# Patient Record
Sex: Male | Born: 1985 | Race: Black or African American | Hispanic: No | Marital: Single | Smoking: Never smoker
Health system: Southern US, Community
[De-identification: ages and names within clinical notes are randomized; demographics above are authoritative.]

---

## 2007-09-19 ENCOUNTER — Emergency Department (HOSPITAL_COMMUNITY): Admission: EM | Admit: 2007-09-19 | Discharge: 2007-09-19 | Payer: Self-pay | Admitting: Emergency Medicine

## 2009-01-25 ENCOUNTER — Emergency Department (HOSPITAL_COMMUNITY): Admission: EM | Admit: 2009-01-25 | Discharge: 2009-01-25 | Payer: Self-pay | Admitting: Family Medicine

## 2009-01-30 ENCOUNTER — Emergency Department (HOSPITAL_COMMUNITY): Admission: EM | Admit: 2009-01-30 | Discharge: 2009-01-30 | Payer: Self-pay | Admitting: Emergency Medicine

## 2011-01-21 LAB — URINALYSIS, ROUTINE W REFLEX MICROSCOPIC
Ketones, ur: NEGATIVE mg/dL
Nitrite: NEGATIVE
Protein, ur: NEGATIVE mg/dL
Urobilinogen, UA: 0.2 mg/dL (ref 0.0–1.0)
pH: 6.5 (ref 5.0–8.0)

## 2011-01-21 LAB — CBC
Hemoglobin: 15.4 g/dL (ref 13.0–17.0)
MCHC: 34.4 g/dL (ref 30.0–36.0)
MCV: 86.1 fL (ref 78.0–100.0)
RBC: 5.19 MIL/uL (ref 4.22–5.81)

## 2011-01-21 LAB — COMPREHENSIVE METABOLIC PANEL
ALT: 16 U/L (ref 0–53)
CO2: 30 mEq/L (ref 19–32)
Calcium: 9.4 mg/dL (ref 8.4–10.5)
Creatinine, Ser: 0.94 mg/dL (ref 0.4–1.5)
GFR calc non Af Amer: >60 mL/min (ref >60)
Glucose, Bld: 92 mg/dL (ref 70–99)
Sodium: 142 mEq/L (ref 135–145)
Total Bilirubin: 0.6 mg/dL (ref 0.3–1.2)

## 2011-01-21 LAB — DIFFERENTIAL
Basophils Absolute: 0 10*3/uL (ref 0.0–0.1)
Basophils Relative: 0 % (ref 0–1)
Eosinophils Absolute: 0.2 10*3/uL (ref 0.0–0.7)
Lymphs Abs: 2.6 10*3/uL (ref 0.7–4.0)
Neutrophils Relative %: 66 % (ref 43–77)

## 2011-01-21 LAB — LIPASE, BLOOD: Lipase: 14 U/L (ref 11–59)

## 2011-07-20 LAB — STREP A DNA PROBE

## 2011-11-07 ENCOUNTER — Emergency Department (HOSPITAL_BASED_OUTPATIENT_CLINIC_OR_DEPARTMENT_OTHER)
Admission: EM | Admit: 2011-11-07 | Discharge: 2011-11-07 | Disposition: A | Discharge status: Home / Self Care | Payer: Self-pay | Attending: Emergency Medicine | Admitting: Emergency Medicine

## 2011-11-07 ENCOUNTER — Encounter (HOSPITAL_BASED_OUTPATIENT_CLINIC_OR_DEPARTMENT_OTHER): Payer: Self-pay

## 2011-11-07 ENCOUNTER — Emergency Department (INDEPENDENT_AMBULATORY_CARE_PROVIDER_SITE_OTHER): Payer: Self-pay

## 2011-11-07 DIAGNOSIS — R05 Cough: Secondary | ICD-10-CM

## 2011-11-07 DIAGNOSIS — F17200 Nicotine dependence, unspecified, uncomplicated: Secondary | ICD-10-CM | POA: Insufficient documentation

## 2011-11-07 DIAGNOSIS — R51 Headache: Secondary | ICD-10-CM | POA: Insufficient documentation

## 2011-11-07 DIAGNOSIS — R059 Cough, unspecified: Secondary | ICD-10-CM

## 2011-11-07 MED ORDER — CYCLOBENZAPRINE HCL 10 MG PO TABS: 10.0000 mg | ORAL_TABLET | Freq: Three times a day (TID) | ORAL | Status: AC | PRN

## 2011-11-07 MED ORDER — METOCLOPRAMIDE HCL 5 MG/ML IJ SOLN
10.0000 mg | Freq: Once | INTRAMUSCULAR | Status: AC
  Administered 2011-11-07: 10 mg via INTRAVENOUS
  Filled 2011-11-07: qty 2

## 2011-11-07 MED ORDER — DEXAMETHASONE SODIUM PHOSPHATE 10 MG/ML IJ SOLN
10.0000 mg | Freq: Once | INTRAMUSCULAR | Status: AC
  Administered 2011-11-07: 10 mg via INTRAVENOUS
  Filled 2011-11-07: qty 1

## 2011-11-07 MED ORDER — DIPHENHYDRAMINE HCL 50 MG/ML IJ SOLN
25.0000 mg | Freq: Once | INTRAMUSCULAR | Status: AC
  Administered 2011-11-07: 25 mg via INTRAVENOUS
  Filled 2011-11-07: qty 1

## 2011-11-07 NOTE — ED Notes (Signed)
Pt c/o HA onset January 2nd.  Pt states HA is on L, denies N/V, changes in vision.  No neuro deficits noted.

## 2011-11-07 NOTE — ED Provider Notes (Signed)
History     CSN: [id number]  Arrival date & time 11/07/11  4098   First MD Initiated Contact with Patient 11/07/11 1010      Chief Complaint  Patient presents with  . Headache    (Consider location/radiation/quality/duration/timing/severity/associated sxs/prior treatment) Patient is a 26 y.o. male presenting with headaches. The history is provided by the patient.  Headache  This is a new problem. Episode onset: One month. The problem occurs constantly. The problem has not changed since onset.The headache is associated with bright light and loud noise (Worse with lying down better when standing). The pain is located in the left unilateral region. The quality of the pain is described as dull and throbbing. The pain is at a severity of 2/10. The pain is mild. Radiates to: Left face, ear, jaw. Pertinent negatives include no fever, no nausea and no vomiting. He has tried acetaminophen, aspirin and NSAIDs for the symptoms. The treatment provided moderate relief.    History reviewed. No pertinent past medical history.  History reviewed. No pertinent past surgical history.  No family history on file.  History  Substance Use Topics  . Smoking status: Current Everyday Smoker  . Smokeless tobacco: Not on file  . Alcohol Use: Yes      Review of Systems  Constitutional: Negative for fever.  Gastrointestinal: Negative for nausea and vomiting.  Neurological: Positive for headaches. Negative for speech difficulty, weakness and numbness.       Mild memory impairment  All other systems reviewed and are negative.    Allergies  Review of patient's allergies indicates no known allergies.  Home Medications   Current Outpatient Rx  Name Route Sig Dispense Refill  . ACETAMINOPHEN 500 MG PO TABS Oral Take 500 mg by mouth every 6 (six) hours as needed.    . ASPIRIN-ACETAMINOPHEN-CAFFEINE 250-250-65 MG PO TABS Oral Take 1 tablet by mouth every 6 (six) hours as needed.    . IBUPROFEN 200 MG  PO TABS Oral Take 200 mg by mouth every 6 (six) hours as needed.      BP 141/77  Pulse 98  Temp(Src) 98.3 F (36.8 C) (Oral)  Resp 16  Ht 5\' 11"  (1.803 m)  Wt 204 lb (92.534 kg)  BMI 28.45 kg/m2  SpO2 97%  Physical Exam  Nursing note and vitals reviewed. Constitutional: He is oriented to person, place, and time. He appears well-developed and well-nourished. No distress.  HENT:  Head: Normocephalic and atraumatic.  Right Ear: Tympanic membrane and ear canal normal.  Left Ear: Tympanic membrane and ear canal normal.  Mouth/Throat: Oropharynx is clear and moist.  Eyes: Conjunctivae and EOM are normal. Pupils are equal, round, and reactive to light. Right eye exhibits no discharge. Left eye exhibits no discharge.  Fundoscopic exam:      The right eye shows no papilledema.       The left eye shows no papilledema.       photophobia  Neck: Normal range of motion. Neck supple. No spinous process tenderness and no muscular tenderness present. No rigidity. No Brudzinski's sign and no Kernig's sign noted.  Cardiovascular: Normal rate, normal heart sounds and intact distal pulses.   No murmur heard. Pulmonary/Chest: Effort normal and breath sounds normal. No respiratory distress. He has no wheezes. He has no rales.  Abdominal: Soft. He exhibits no distension. There is no tenderness.  Musculoskeletal: Normal range of motion. He exhibits no edema and no tenderness.  Lymphadenopathy:    He has no cervical adenopathy.  Neurological: He is alert and oriented to person, place, and time. He has normal strength. No cranial nerve deficit or sensory deficit. Coordination and gait normal. GCS eye subscore is 4. GCS verbal subscore is 5. GCS motor subscore is 6.  Skin: Skin is warm and dry.  Psychiatric: He has a normal mood and affect. His behavior is normal.    ED Course  Procedures (including critical care time)  Labs Reviewed - No data to display Ct Head Wo Contrast  11/07/2011  *RADIOLOGY  REPORT*  Clinical Data: Headaches for 1 month  CT HEAD WITHOUT CONTRAST  Technique:  Contiguous axial images were obtained from the base of the skull through the vertex without contrast.  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  The brain has an otherwise normal appearance without evidence for hemorrhage, infarction, hydrocephalus, or mass lesion.  There is no extra axial fluid collection.  The skull and paranasal sinuses are normal.  IMPRESSION:  1.  No acute intracranial abnormalities. 2.  Question mild tonsillar ectopia.  If the patient's symptoms persists then a non emergent MRI may be helpful for further evaluation.  Original Report Authenticated By: Rosealee Albee, M.D.     No diagnosis found.    MDM   Patient with a persistent headache for the last one month. He states it gets better with Tylenol or Excedrin and then returns. It is worse in the morning when he wakes up and then improves as the day goes on or when he is standing. He denies any focal deficits but states he has had some mild memory issues. He denies any infectious symptoms or neck pain. He has normal neurologic exam. No symptoms suggestive of a subarachnoid hemorrhage such as worst of life, abrupt onset. He denies any trauma. Head CT pending to rule out space-occupying lesion and headache cocktail given.   Patient feeling better after headache cocktail and head CT negative. Will discharge home     Gwyneth Sprout, MD 11/07/11 1118

## 2015-08-01 ENCOUNTER — Emergency Department (HOSPITAL_BASED_OUTPATIENT_CLINIC_OR_DEPARTMENT_OTHER): Payer: BLUE CROSS/BLUE SHIELD

## 2015-08-01 ENCOUNTER — Emergency Department (HOSPITAL_BASED_OUTPATIENT_CLINIC_OR_DEPARTMENT_OTHER)
Admission: EM | Admit: 2015-08-01 | Discharge: 2015-08-01 | Disposition: A | Discharge status: Home / Self Care | Payer: BLUE CROSS/BLUE SHIELD | Attending: Emergency Medicine | Admitting: Emergency Medicine

## 2015-08-01 ENCOUNTER — Encounter (HOSPITAL_BASED_OUTPATIENT_CLINIC_OR_DEPARTMENT_OTHER): Payer: Self-pay | Admitting: *Deleted

## 2015-08-01 DIAGNOSIS — Z87891 Personal history of nicotine dependence: Secondary | ICD-10-CM | POA: Insufficient documentation

## 2015-08-01 DIAGNOSIS — M542 Cervicalgia: Secondary | ICD-10-CM | POA: Diagnosis present

## 2015-08-01 DIAGNOSIS — R202 Paresthesia of skin: Secondary | ICD-10-CM | POA: Diagnosis not present

## 2015-08-01 NOTE — ED Notes (Signed)
Pain in his neck for 2 weeks. A few days after his neck started hurting. His left arm became numb. 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

## 2015-08-01 NOTE — ED Provider Notes (Signed)
CSN: [id number]     Arrival date & time 08/01/15  1548 History   First MD Initiated Contact with Patient 08/01/15 1556     Chief Complaint  Patient presents with  . Neck Pain     (Consider location/radiation/quality/duration/timing/severity/associated sxs/prior Treatment) HPI   Blood pressure 122/76, pulse 86, temperature 98.3 F (36.8 C), temperature source Oral, resp. rate 20, height 5\' 11"  (1.803 m), weight 190 lb (86.183 kg), SpO2 100 %.  Nathan Harrell is a 29 y.o. male complaining of  no significant past medical history presents today with neck pain. He states that he first noticed the pain about a week and a half ago at work as a Oncologist for [employer]. He states that the pain is centrally located in the posterior cervical spine. Pain is mild to moderate, 5 out of 10 and it alleviated with Motrin. He states that at rest his pain is mild but after a period of inactivity when he gets up the pain is more severe. Pain is not worse with general movement. Patient reports pins and needles paresthesia to the entirety of the left arm and leg both onset at the same time both onset with the leg pain approximately 10 days ago.  It is relieved with ibuprofen. Denies fever, chills, new onset weakness, numbness, bowel/bladder incontinence, abdominal pain, n/v/d, chest pain, or SOB, change in vision dysarthria, ataxia. Does not endorse shoulder pain, elbow pain, hip pain, or knee pain.  Of note, his mother is in the room and states that his speech has changed over the last week. She states that it's slower than normal but not particularly slurred  History reviewed. No pertinent past medical history. History reviewed. No pertinent past surgical history. No family history on file. Social History  Substance Use Topics  . Smoking status: Former Research scientist (life sciences)  . Smokeless tobacco: None  . Alcohol Use: Yes    Review of Systems  10 systems reviewed and found to be negative, except as noted in the  HPI.  Allergies  Review of patient's allergies indicates no known allergies.  Home Medications   Prior to Admission medications   Medication Sig Start Date End Date Taking? Authorizing Provider  acetaminophen (TYLENOL) 500 MG tablet Take 500 mg by mouth every 6 (six) hours as needed.    Historical Provider, MD  aspirin-acetaminophen-caffeine (EXCEDRIN MIGRAINE) [phone] MG per tablet Take 1 tablet by mouth every 6 (six) hours as needed.    Historical Provider, MD  ibuprofen (ADVIL,MOTRIN) 200 MG tablet Take 200 mg by mouth every 6 (six) hours as needed.    Historical Provider, MD   BP 122/76 mmHg  Pulse 86  Temp(Src) 98.3 F (36.8 C) (Oral)  Resp 20  Ht 5\' 11"  (1.803 m)  Wt 190 lb (86.183 kg)  BMI 26.51 kg/m2  SpO2 100% Physical Exam  Constitutional: He is oriented to person, place, and time. He appears well-developed and well-nourished.  HENT:  Head: Normocephalic and atraumatic.  Mouth/Throat: Oropharynx is clear and moist.  Eyes: Conjunctivae and EOM are normal. Pupils are equal, round, and reactive to light.  No TTP of maxillary or frontal sinuses  No TTP or induration of temporal arteries bilaterally  Neck: Normal range of motion. Neck supple.  FROM to C-spine. Pt can touch chin to chest without discomfort. No TTP of midline cervical spine.   Cardiovascular: Normal rate, regular rhythm and intact distal pulses.   Pulmonary/Chest: Effort normal and breath sounds normal. No respiratory distress. He has no  wheezes. He has no rales. He exhibits no tenderness.  Abdominal: Soft. Bowel sounds are normal. There is no tenderness.  Musculoskeletal: Normal range of motion. He exhibits no edema or tenderness.  Neurological: He is alert and oriented to person, place, and time. No cranial nerve deficit.  II-Visual fields grossly intact. III/IV/VI-Extraocular movements intact.  Pupils reactive bilaterally. V/VII-Smile symmetric, equal eyebrow raise,  facial sensation intact VIII-  Hearing grossly intact IX/X-Normal gag XI-bilateral shoulder shrug XII-midline tongue extension Motor: 5/5 bilaterally with normal tone and bulk Cerebellar: Normal finger-to-nose  and normal heel-to-shin test.   Romberg negative Ambulates with a coordinated gait   Nursing note and vitals reviewed.   ED Course  Procedures (including critical care time) Labs Review Labs Reviewed - No data to display  Imaging Review Ct Head Wo Contrast  08/01/2015  CLINICAL DATA:  Left arm and leg numbness.  Neck pain for 2 weeks. EXAM: CT HEAD WITHOUT CONTRAST CT CERVICAL SPINE WITHOUT CONTRAST TECHNIQUE: Multidetector CT imaging of the head and cervical spine was performed following the standard protocol without intravenous contrast. Multiplanar CT image reconstructions of the cervical spine were also generated. COMPARISON:  Head CT - 11/07/2011 FINDINGS: CT HEAD FINDINGS Gray-white differentiation is maintained. No CT evidence of acute large territory infarct. No intraparenchymal or extra-axial mass or hemorrhage. Normal size a configuration of the ventricles and basilar cisterns. No midline shift. Limited visualization of the paranasal sinuses and mastoid air cells is normal. No air-fluid levels. Regional soft tissues appear normal. CT CERVICAL SPINE FINDINGS C1 to the superior endplate of T2 is imaged. Normal alignment of the cervical spine. No anterolisthesis or retrolisthesis. The dens is normally positioned between the lateral masses of C1. Normal atlantodental and toe axial articulations. The bilateral facets are normally aligned. No fracture or static subluxation of the cervical spine. Cervical vertebral body heights are preserved. Prevertebral soft tissues are normal. Intervertebral disc space heights are preserved. Regional soft tissues appear normal. Normal noncontrast appearance of the thyroid gland. Limited visualization of lung apices is normal. IMPRESSION: 1. Negative noncontrast head CT. 2. No  fracture or static subluxation of the cervical spine. Electronically Signed   By: Sandi Mariscal M.D.   On: 08/01/2015 17:22   Ct Cervical Spine Wo Contrast  08/01/2015  CLINICAL DATA:  Left arm and leg numbness.  Neck pain for 2 weeks. EXAM: CT HEAD WITHOUT CONTRAST CT CERVICAL SPINE WITHOUT CONTRAST TECHNIQUE: Multidetector CT imaging of the head and cervical spine was performed following the standard protocol without intravenous contrast. Multiplanar CT image reconstructions of the cervical spine were also generated. COMPARISON:  Head CT - 11/07/2011 FINDINGS: CT HEAD FINDINGS Gray-white differentiation is maintained. No CT evidence of acute large territory infarct. No intraparenchymal or extra-axial mass or hemorrhage. Normal size a configuration of the ventricles and basilar cisterns. No midline shift. Limited visualization of the paranasal sinuses and mastoid air cells is normal. No air-fluid levels. Regional soft tissues appear normal. CT CERVICAL SPINE FINDINGS C1 to the superior endplate of T2 is imaged. Normal alignment of the cervical spine. No anterolisthesis or retrolisthesis. The dens is normally positioned between the lateral masses of C1. Normal atlantodental and toe axial articulations. The bilateral facets are normally aligned. No fracture or static subluxation of the cervical spine. Cervical vertebral body heights are preserved. Prevertebral soft tissues are normal. Intervertebral disc space heights are preserved. Regional soft tissues appear normal. Normal noncontrast appearance of the thyroid gland. Limited visualization of lung apices is normal. IMPRESSION: 1.  Negative noncontrast head CT. 2. No fracture or static subluxation of the cervical spine. Electronically Signed   By: Sandi Mariscal M.D.   On: 08/01/2015 17:22   I have personally reviewed and evaluated these images and lab results as part of my medical decision-making.   EKG Interpretation None      MDM   Final diagnoses:   Cervicalgia  Paresthesia    Filed Vitals:   08/01/15 1554  BP: 122/76  Pulse: 86  Temp: 98.3 F (36.8 C)  TempSrc: Oral  Resp: 20  Height: 5\' 11"  (1.803 m)  Weight: 190 lb (86.183 kg)  SpO2: 100%    Nathan Harrell is 29 y.o. male presenting with cervicalgia and pins and needles paresthesia to the entire left arm and leg. Onset 2 weeks ago. This is atraumatic. Neuro exam nonfocal, CT head and neck negative. This is a strange presentation, encouraged him to take high-dose Motrin, he doesn't have a primary care doctor given him a referral to [facility] neurology if symptoms persist.  Evaluation does not show pathology that would require ongoing emergent intervention or inpatient treatment. Pt is hemodynamically stable and mentating appropriately. Discussed findings and plan with patient/guardian, who agrees with care plan. All questions answered. Return precautions discussed and outpatient follow up given.    Monico Blitz, PA-C 08/01/15 1753  Veryl Speak, MD 08/01/15 2019

## 2015-08-01 NOTE — Discharge Instructions (Signed)
For pain control please take ibuprofen (also known as Motrin or Advil) 800mg  (this is normally 4 over the counter pills) 3 times a day  for 5 days. Take with food to minimize stomach irritation.  Please follow with your primary care doctor in the next 2 days for a check-up. They must obtain records for further management.   Do not hesitate to return to the Emergency Department for any new, worsening or concerning symptoms.    Paresthesia Paresthesia is an abnormal burning or prickling sensation. This sensation is generally felt in the hands, arms, legs, or feet. However, it may occur in any part of the body. Usually, it is not painful. The feeling may be described as:  Tingling or numbness.  Pins and needles.  Skin crawling.  Buzzing.  Limbs falling asleep.  Itching. Most people experience temporary (transient) paresthesia at some time in their lives. Paresthesia may occur when you breathe too quickly (hyperventilation). It can also occur without any apparent cause. Commonly, paresthesia occurs when pressure is placed on a nerve. The sensation quickly goes away after the pressure is removed. For some people, however, paresthesia is a long-lasting (chronic) condition that is caused by an underlying disorder. If you continue to have paresthesia, you may need further medical evaluation. HOME CARE INSTRUCTIONS Watch your condition for any changes. Taking the following actions may help to lessen any discomfort that you are feeling:  Avoid drinking alcohol.  Try acupuncture or massage to help relieve your symptoms.  Keep all follow-up visits as directed by your health care provider. This is important. SEEK MEDICAL CARE IF:  You continue to have episodes of paresthesia.  Your burning or prickling feeling gets worse when you walk.  You have pain, cramps, or dizziness.  You develop a rash. SEEK IMMEDIATE MEDICAL CARE IF:  You feel weak.  You have trouble walking or moving.  You  have problems with speech, understanding, or vision.  You feel confused.  You cannot control your bladder or bowel movements.  You have numbness after an injury.  You faint.   This information is not intended to replace advice given to you by your health care provider. Make sure you discuss any questions you have with your health care provider.   Document Released: 09/18/2002 Document Revised: 02/12/2015 Document Reviewed: 09/24/2014 Elsevier Interactive Patient Education [employer].

## 2015-08-01 NOTE — ED Notes (Signed)
Returned from CT.

## 2015-08-12 ENCOUNTER — Ambulatory Visit (INDEPENDENT_AMBULATORY_CARE_PROVIDER_SITE_OTHER): Payer: BLUE CROSS/BLUE SHIELD | Admitting: Diagnostic Neuroimaging

## 2015-08-12 ENCOUNTER — Encounter: Payer: Self-pay | Admitting: Diagnostic Neuroimaging

## 2015-08-12 VITALS — BP 121/76 | HR 76 | Ht 71.0 in | Wt 200.8 lb

## 2015-08-12 DIAGNOSIS — R200 Anesthesia of skin: Secondary | ICD-10-CM | POA: Diagnosis not present

## 2015-08-12 DIAGNOSIS — R4781 Slurred speech: Secondary | ICD-10-CM

## 2015-08-12 DIAGNOSIS — R292 Abnormal reflex: Secondary | ICD-10-CM | POA: Diagnosis not present

## 2015-08-12 NOTE — Patient Instructions (Signed)
Thank you for coming to see [place] at [facility]. I hope we have been able to provide you high quality care today.  You may receive a patient satisfaction survey over the next few weeks. We would appreciate your feedback and comments so that we may continue to improve ourselves and the health of our patients.  - I will check MRI brain and cervical spine - I will check labs testing - talk with your employer about FMLA paper work for medical leave  - caution with balance, walking, driving   ~~~~~~~~~~~~~~~~~~~~~~~~~~~~~~~~~~~~~~~~~~~~~~~~~~~~~~~~~~~~~~~~~  DR. Sinclair Alligood'S GUIDE TO HAPPY AND HEALTHY LIVING These are some of my general health and wellness recommendations. Some of them may apply to you better than others. Please use common sense as you try these suggestions and feel free to ask me any questions.   ACTIVITY/FITNESS Mental, social, emotional and physical stimulation are very important for brain and body health. Try learning a new activity (arts, music, language, sports, games).  Keep moving your body to the best of your abilities. You can do this at home, inside or outside, the park, community center, gym or anywhere you like. Consider a physical therapist or personal trainer to get started. Consider the app Sworkit. Fitness trackers such as smart-watches, smart-phones or Fitbits can help as well.   NUTRITION Eat more plants: colorful vegetables, nuts, seeds and berries.  Eat less sugar, salt, preservatives and processed foods.  Avoid toxins such as cigarettes and alcohol.  Drink water when you are thirsty. Warm water with a slice of lemon is an excellent morning drink to start the day.  Consider these websites for more information The Nutrition Source (https://www.henry-hernandez.biz/) Precision Nutrition (WindowBlog.ch)   RELAXATION Consider practicing mindfulness meditation or other relaxation techniques such  as deep breathing, prayer, yoga, tai chi, massage. See website mindful.org or the apps Headspace or Calm to help get started.   SLEEP Try to get at least 7-8+ hours sleep per day. Regular exercise and reduced caffeine will help you sleep better. Practice good sleep hygeine techniques. See website sleep.org for more information.   PLANNING Prepare estate planning, living will, healthcare POA documents. Sometimes this is best planned with the help of an attorney. Theconversationproject.org and agingwithdignity.org are excellent resources.

## 2015-08-12 NOTE — Progress Notes (Signed)
[facility]  PATIENT: Nathan Harrell DOB: 1986-09-01  REFERRING CLINICIAN: ER  HISTORY FROM: patient and mother  REASON FOR VISIT: new consult    HISTORICAL  CHIEF COMPLAINT:  Chief Complaint  Patient presents with  . Cervicalgia, paresthesia    rm 6, ED referral, New Patient, mother-Sharon    HISTORY OF PRESENT ILLNESS:   29 year old right-handed male here for evaluation of constellation of symptoms. 3 weeks ago patient woke up with left arm and left leg numbness. He had some pain in his neck on left side. Now having some numbness and pain on his right hand. Overall he is had some balance difficulty, clumsiness, speech difficulty, slurred speech. Symptoms have been persistent for approximately 3 weeks.  No prior similar events. No prior neurologic attacks of different type. No unilateral vision loss or double vision. No bowel or bladder difficulty.  Patient went to the emergency room for evaluation, had CT scan of the head which is unremarkable. Patient then referred to me for further evaluation.  No family history of autoimmune disease, multiple sclerosis, or other neurologic diseases. Maternal grandmother had brain tumor and stroke.   REVIEW OF SYSTEMS: Full 14 system review of systems performed and notable only for only as per history of present illness. Otherwise negative  ALLERGIES: No Known Allergies  HOME MEDICATIONS: Outpatient Prescriptions Prior to Visit  Medication Sig Dispense Refill  . ibuprofen (ADVIL,MOTRIN) 200 MG tablet Take 200 mg by mouth every 6 (six) hours as needed.    Marland Kitchen acetaminophen (TYLENOL) 500 MG tablet Take 500 mg by mouth every 6 (six) hours as needed.    Marland Kitchen aspirin-acetaminophen-caffeine (EXCEDRIN MIGRAINE) 250-250-65 MG per tablet Take 1 tablet by mouth every 6 (six) hours as needed.     No facility-administered medications prior to visit.    PAST MEDICAL HISTORY: No past medical history on file.  PAST SURGICAL  HISTORY: No past surgical history on file.  FAMILY HISTORY: Family History  Problem Relation Age of Onset  . Healthy Mother   . Healthy Father   . Healthy Sister   . Healthy Brother   . Brain cancer Maternal Grandmother   . Stroke Maternal Grandmother     SOCIAL HISTORY:  Social History   Social History  . Marital Status: Single    Spouse Name: N/A  . Number of Children: 0  . Years of Education: 13.5   Occupational History  .      [employer]   Social History Main Topics  . Smoking status: Never Smoker   . Smokeless tobacco: Not on file  . Alcohol Use: Yes     Comment: occasionally  . Drug Use: No  . Sexual Activity: Not on file   Other Topics Concern  . Not on file   Social History Narrative   Lives with brother   Caffeine use- coffee - approx 3 x week     PHYSICAL EXAM  GENERAL EXAM/CONSTITUTIONAL: Vitals:  Filed Vitals:   08/12/15 0841  BP: 121/76  Pulse: 76  Height: 5' 11"  (1.803 m)  Weight: 200 lb 12.8 oz (91.082 kg)     Body mass index is 28.02 kg/(m^2).  Visual Acuity Screening   Right eye Left eye Both eyes  Without correction: 20/20 20/20   With correction:        Patient is in no distress; well developed, nourished and groomed; neck is supple  CARDIOVASCULAR:  Examination of carotid arteries is normal; no carotid bruits  Regular rate  and rhythm, no murmurs  Examination of peripheral vascular system by observation and palpation is normal  EYES:  Ophthalmoscopic exam of optic discs and posterior segments is normal; no papilledema or hemorrhages  MUSCULOSKELETAL:  Gait, strength, tone, movements noted in Neurologic exam below  NEUROLOGIC: MENTAL STATUS:  No flowsheet data found.  awake, alert, oriented to person, place and time  recent and remote memory intact  normal attention and concentration  language fluent, comprehension intact, naming intact,   fund of knowledge appropriate  CRANIAL NERVE:   2nd - no  papilledema on fundoscopic exam  2nd, 3rd, 4th, 6th - pupils equal and reactive to light, visual fields full to confrontation, extraocular muscles intact, no nystagmus  5th - facial sensation symmetric  7th - facial strength symmetric  8th - hearing intact  9th - palate elevates symmetrically, uvula midline  11th - shoulder shrug symmetric  12th - tongue protrusion midline  MILD SLURRED SPEECH  MOTOR:   normal bulk and tone, full strength in the BUE, BLE  SENSORY:   normal and symmetric to light touch, pinprick, temperature, vibration; EXCEPT SLIGHT DECR PP IN LEFT HAND  COORDINATION:   finger-nose-finger, fine finger movements --> SLOWER WITH DYSMETRIA ON LEFT  REFLEXES:   deep tendon reflexes BRISK and symmetric; CLONUS IN L > R ANKLES  GAIT/STATION:   narrow based gait; able to walk on toes, heels; DIFF WITH TANDEM; romberg is negative    DIAGNOSTIC DATA (LABS, IMAGING, TESTING) - I reviewed patient records, labs, notes, testing and imaging myself where available.  Lab Results  Component Value Date   WBC 10.1 01/30/2009   HGB 15.4 01/30/2009   HCT 44.7 01/30/2009   MCV 86.1 01/30/2009   PLT 368 01/30/2009      Component Value Date/Time   NA 142 01/30/2009 1523   K 3.6 01/30/2009 1523   CL 104 01/30/2009 1523   CO2 30 01/30/2009 1523   GLUCOSE 92 01/30/2009 1523   BUN 3* 01/30/2009 1523   CREATININE 0.94 01/30/2009 1523   CALCIUM 9.4 01/30/2009 1523   PROT 7.4 01/30/2009 1523   ALBUMIN 3.9 01/30/2009 1523   AST 19 01/30/2009 1523   ALT 16 01/30/2009 1523   ALKPHOS 50 01/30/2009 1523   BILITOT 0.6 01/30/2009 1523   GFRNONAA >60 01/30/2009 1523   GFRAA  01/30/2009 1523    >60        The eGFR has been calculated using the MDRD equation. This calculation has not been validated in all clinical situations. eGFR's persistently <60 mL/min signify possible Chronic Kidney Disease.   No results found for: CHOL, HDL, LDLCALC, LDLDIRECT, TRIG,  CHOLHDL No results found for: HGBA1C No results found for: VITAMINB12 No results found for: TSH  08/01/15 CT head [I reviewed images myself and agree with interpretation. -VRP]  - Negative noncontrast head CT.  08/01/15 CT cervical spine [I reviewed images myself and agree with interpretation. -VRP]  - No fracture or static subluxation of the cervical spine.    ASSESSMENT AND PLAN  29 y.o. year old male here with new onset left-sided numbness, slurred speech, hyperreflexia. Symptoms localize to brain or cervical spine. We'll need to rule out autoimmune, inflammatory, demyelinating or vascular etiologies. Will also check blood testing to evaluate for other causes of symptoms.  Dx:  Left sided numbness - Plan: MR Brain W Wo Contrast, MR Cervical Spine W Wo Contrast, CBC with Differential/Platelet, Comprehensive metabolic panel, ANA w/Reflex, Pan-ANCA, Angiotensin converting enzyme, HIV antibody (with reflex),  Vitamin B12, TSH, Hemoglobin A1c  Slurred speech - Plan: MR Brain W Wo Contrast, MR Cervical Spine W Wo Contrast, CBC with Differential/Platelet, Comprehensive metabolic panel, ANA w/Reflex, Pan-ANCA, Angiotensin converting enzyme, HIV antibody (with reflex), Vitamin B12, TSH, Hemoglobin A1c  Hyperreflexia - Plan: MR Brain W Wo Contrast, MR Cervical Spine W Wo Contrast, CBC with Differential/Platelet, Comprehensive metabolic panel, ANA w/Reflex, Pan-ANCA, Angiotensin converting enzyme, HIV antibody (with reflex), Vitamin B12, TSH, Hemoglobin A1c    PLAN:  Orders Placed This Encounter  Procedures  . MR Brain W Wo Contrast  . MR Cervical Spine W Wo Contrast  . CBC with Differential/Platelet  . Comprehensive metabolic panel  . ANA w/Reflex  . Pan-ANCA  . Angiotensin converting enzyme  . HIV antibody (with reflex)  . Vitamin B12  . TSH  . Hemoglobin A1c   Return in about 1 month (around 09/11/2015).    Penni Bombard, MD 64/35/3912, 2:58 AM Certified in Neurology,  Neurophysiology and Neuroimaging  [facility] [address] [place], [place] [address] [phone]

## 2015-08-14 ENCOUNTER — Telehealth: Payer: Self-pay | Admitting: *Deleted

## 2015-08-14 LAB — ANGIOTENSIN CONVERTING ENZYME: ANGIO CONVERT ENZYME: 43 U/L (ref 14–82)

## 2015-08-14 LAB — COMPREHENSIVE METABOLIC PANEL
A/G RATIO: 1.6 (ref 1.1–2.5)
ALBUMIN: 4.5 g/dL (ref 3.5–5.5)
ALT: 11 IU/L (ref 0–44)
AST: 17 IU/L (ref 0–40)
Alkaline Phosphatase: 45 IU/L (ref 39–117)
BUN / CREAT RATIO: 11 (ref 8–19)
BUN: 10 mg/dL (ref 6–20)
Bilirubin Total: 0.3 mg/dL (ref 0.0–1.2)
CALCIUM: 9.5 mg/dL (ref 8.7–10.2)
CO2: 26 mmol/L (ref 18–29)
Chloride: 100 mmol/L (ref 97–106)
Creatinine, Ser: 0.95 mg/dL (ref 0.76–1.27)
GFR calc Af Amer: 125 mL/min/{1.73_m2} (ref >59)
GFR, EST NON AFRICAN AMERICAN: 108 mL/min/{1.73_m2} (ref >59)
GLOBULIN, TOTAL: 2.9 g/dL (ref 1.5–4.5)
Glucose: 84 mg/dL (ref 65–99)
POTASSIUM: 4.4 mmol/L (ref 3.5–5.2)
SODIUM: 141 mmol/L (ref 136–144)
Total Protein: 7.4 g/dL (ref 6.0–8.5)

## 2015-08-14 LAB — CBC WITH DIFFERENTIAL/PLATELET
BASOS: 0 %
Basophils Absolute: 0 10*3/uL (ref 0.0–0.2)
EOS (ABSOLUTE): 0.1 10*3/uL (ref 0.0–0.4)
EOS: 2 %
HEMATOCRIT: 47.6 % (ref 37.5–51.0)
HEMOGLOBIN: 15.8 g/dL (ref 12.6–17.7)
IMMATURE GRANULOCYTES: 0 %
Immature Grans (Abs): 0 10*3/uL (ref 0.0–0.1)
LYMPHS ABS: 2.2 10*3/uL (ref 0.7–3.1)
Lymphs: 32 %
MCH: 29 pg (ref 26.6–33.0)
MCHC: 33.2 g/dL (ref 31.5–35.7)
MCV: 88 fL (ref 79–97)
MONOS ABS: 0.4 10*3/uL (ref 0.1–0.9)
Monocytes: 6 %
Neutrophils Absolute: 4.1 10*3/uL (ref 1.4–7.0)
Neutrophils: 60 %
Platelets: 291 10*3/uL (ref 150–379)
RBC: 5.44 x10E6/uL (ref 4.14–5.80)
RDW: 13.8 % (ref 12.3–15.4)
WBC: 6.8 10*3/uL (ref 3.4–10.8)

## 2015-08-14 LAB — HEMOGLOBIN A1C
ESTIMATED AVERAGE GLUCOSE: 108 mg/dL
HEMOGLOBIN A1C: 5.4 % (ref 4.8–5.6)

## 2015-08-14 LAB — ANA W/REFLEX: Anti Nuclear Antibody(ANA): NEGATIVE

## 2015-08-14 LAB — VITAMIN B12: VITAMIN B 12: 243 pg/mL (ref 211–946)

## 2015-08-14 LAB — PAN-ANCA
ANCA Proteinase 3: <3.5 U/mL (ref 0.0–3.5)
Atypical pANCA: <1:20 {titer}
C-ANCA: <1:20 {titer}

## 2015-08-14 LAB — TSH: TSH: 1.27 u[IU]/mL (ref 0.450–4.500)

## 2015-08-14 LAB — HIV ANTIBODY (ROUTINE TESTING W REFLEX): HIV SCREEN 4TH GENERATION: NONREACTIVE

## 2015-08-14 NOTE — Telephone Encounter (Signed)
Left VM for patient informing him his lab results are normal. Also informed him he needs to call [facility] at [phone] to schedule his MRIs. Left this caller's name , number. Phone staff may repeat above message if patient calls back.

## 2015-08-24 ENCOUNTER — Other Ambulatory Visit: Payer: BLUE CROSS/BLUE SHIELD

## 2015-08-30 ENCOUNTER — Ambulatory Visit
Admission: RE | Admit: 2015-08-30 | Discharge: 2015-08-30 | Disposition: A | Discharge status: Home / Self Care | Payer: BLUE CROSS/BLUE SHIELD | Source: Ambulatory Visit | Attending: Diagnostic Neuroimaging | Admitting: Diagnostic Neuroimaging

## 2015-08-30 DIAGNOSIS — R4781 Slurred speech: Secondary | ICD-10-CM

## 2015-08-30 DIAGNOSIS — R292 Abnormal reflex: Secondary | ICD-10-CM

## 2015-08-30 DIAGNOSIS — R200 Anesthesia of skin: Secondary | ICD-10-CM

## 2015-08-30 MED ORDER — GADOBENATE DIMEGLUMINE 529 MG/ML IV SOLN
19.0000 mL | Freq: Once | INTRAVENOUS | Status: AC | PRN
  Administered 2015-08-30: 19 mL via INTRAVENOUS

## 2015-09-09 ENCOUNTER — Telehealth: Payer: Self-pay | Admitting: Diagnostic Neuroimaging

## 2015-09-09 NOTE — Telephone Encounter (Signed)
I called patient. No answer x 3. Will try again in AM. -VRP

## 2015-09-10 NOTE — Telephone Encounter (Signed)
I called patient. Patient numbness is stable/slightly better. Neck pain stable. Balance still affected. I reviewed MRI results. 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 Will see patient this Thursday and determine futher course (malignancy screening, labs, LP, vs referral to academic center / [facility]).  Penni Bombard, MD [id number], 0000000 PM Certified in Neurology, Neurophysiology and Neuroimaging  [facility] [address] [place], [place] [address] [phone]

## 2015-09-12 ENCOUNTER — Encounter: Payer: Self-pay | Admitting: Diagnostic Neuroimaging

## 2015-09-12 ENCOUNTER — Ambulatory Visit (INDEPENDENT_AMBULATORY_CARE_PROVIDER_SITE_OTHER): Payer: BLUE CROSS/BLUE SHIELD | Admitting: Diagnostic Neuroimaging

## 2015-09-12 VITALS — BP 125/78 | HR 79 | Ht 71.0 in | Wt 206.0 lb

## 2015-09-12 DIAGNOSIS — D496 Neoplasm of unspecified behavior of brain: Secondary | ICD-10-CM

## 2015-09-12 NOTE — Patient Instructions (Signed)
-   I will setup additional testing

## 2015-09-12 NOTE — Progress Notes (Signed)
[facility]  PATIENT: Nathan Harrell DOB: 1986-03-12  REFERRING CLINICIAN:  HISTORY FROM: patient and mother  REASON FOR VISIT: follow up   HISTORICAL  CHIEF COMPLAINT:  Chief Complaint  Patient presents with  . Left sided numbness    rm 6, motherIvin Harrell, review MRI results  . Follow-up    1 month     HISTORY OF PRESENT ILLNESS:   UPDATE 09/12/15: Since last visit, sxs slightly improved. Numbness has resolved. Balance and speech are stable. 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 Here for further discussion and diagnosis/treatment options. Now established with Dr. Stephanie Harrell for PCP. Had steroid shot last week with PCP for neck pain symptoms.   PRIOR HPI (08/12/15): 29 year old right-handed male here for evaluation of constellation of symptoms. 3 weeks ago patient woke up with left arm and left leg numbness. He had some pain in his neck on left side. Now having some numbness and pain on his right hand. Overall he is had some balance difficulty, clumsiness, speech difficulty, slurred speech. Symptoms have been persistent for approximately 3 weeks. No prior similar events. No prior neurologic attacks of different type. No unilateral vision loss or double vision. No bowel or bladder difficulty. Patient went to the emergency room for evaluation, had CT scan of the head which is unremarkable. Patient then referred to me for further evaluation. No family history of autoimmune disease, multiple sclerosis, or other neurologic diseases. Maternal grandmother had brain tumor and stroke.   REVIEW OF SYSTEMS: Full 14 system review of systems performed and notable only for numbness back pain walking diff neck pain neck stiffness.    ALLERGIES: No Known Allergies  HOME MEDICATIONS: Outpatient Prescriptions Prior to Visit  Medication Sig Dispense Refill  . ibuprofen (ADVIL,MOTRIN) 200 MG tablet Take 200 mg by mouth every 6 (six) hours as needed.    Marland Kitchen acetaminophen  (TYLENOL) 500 MG tablet Take 500 mg by mouth every 6 (six) hours as needed.    Marland Kitchen aspirin-acetaminophen-caffeine (EXCEDRIN MIGRAINE) 250-250-65 MG per tablet Take 1 tablet by mouth every 6 (six) hours as needed.     No facility-administered medications prior to visit.    PAST MEDICAL HISTORY: History reviewed. No pertinent past medical history.  PAST SURGICAL HISTORY: History reviewed. No pertinent past surgical history.  FAMILY HISTORY: Family History  Problem Relation Age of Onset  . Healthy Mother   . Healthy Father   . Healthy Sister   . Healthy Brother   . Brain cancer Maternal Grandmother   . Stroke Maternal Grandmother     SOCIAL HISTORY:  Social History   Social History  . Marital Status: Single    Spouse Name: N/A  . Number of Children: 0  . Years of Education: 13.5   Occupational History  .      [employer]   Social History Main Topics  . Smoking status: Never Smoker   . Smokeless tobacco: Not on file  . Alcohol Use: Yes     Comment: occasionally  . Drug Use: No  . Sexual Activity: Not on file   Other Topics Concern  . Not on file   Social History Narrative   Lives with brother   Caffeine use- coffee - approx 3 x week     PHYSICAL EXAM  GENERAL EXAM/CONSTITUTIONAL: Vitals:  Filed Vitals:   09/12/15 1621  BP: 125/78  Pulse: 79  Height: 5\' 11"  (1.803 m)  Weight: 206 lb (93.441 kg)   Body mass index is 28.74  kg/(m^2). No exam data present  Patient is in no distress; well developed, nourished and groomed; neck is supple  CARDIOVASCULAR:  Examination of carotid arteries is normal; no carotid bruits  Regular rate and rhythm, no murmurs  Examination of peripheral vascular system by observation and palpation is normal  EYES:  Ophthalmoscopic exam of optic discs and posterior segments is normal; no papilledema or hemorrhages  MUSCULOSKELETAL:  Gait, strength, tone, movements noted in Neurologic exam below  NEUROLOGIC: MENTAL STATUS:   No flowsheet data found.  awake, alert, oriented to person, place and time  recent and remote memory intact  normal attention and concentration  language fluent, comprehension intact, naming intact,   fund of knowledge appropriate  CRANIAL NERVE:   2nd - no papilledema on fundoscopic exam  2nd, 3rd, 4th, 6th - pupils equal and reactive to light, visual fields full to confrontation, extraocular muscles intact, no nystagmus  5th - facial sensation symmetric  7th - facial strength symmetric; DECR RIGHT NL FOLD  8th - hearing intact  9th - palate elevates symmetrically, uvula midline  11th - shoulder shrug symmetric  12th - tongue protrusion midline  MILD SLURRED SPEECH  MOTOR:   normal bulk and tone, full strength in the BUE, BLE  SENSORY:   normal and symmetric to light touch, pinprick, temperature, vibration; EXCEPT SLIGHT DECR PP IN LEFT HAND  COORDINATION:   finger-nose-finger, fine finger movements --> SLOWER WITH DYSMETRIA ON LEFT  REFLEXES:   deep tendon reflexes BRISK and symmetric; CLONUS IN L > R ANKLES  GAIT/STATION:   narrow based gait; able to walk on toes, heels; DIFF WITH TANDEM; romberg is negative    DIAGNOSTIC DATA (LABS, IMAGING, TESTING) - I reviewed patient records, labs, notes, testing and imaging myself where available.  Lab Results  Component Value Date   WBC 6.8 08/12/2015   HGB 15.4 01/30/2009   HCT 47.6 08/12/2015   MCV 86.1 01/30/2009   PLT 368 01/30/2009      Component Value Date/Time   NA 141 08/12/2015 0930   NA 142 01/30/2009 1523   K 4.4 08/12/2015 0930   CL 100 08/12/2015 0930   CO2 26 08/12/2015 0930   GLUCOSE 84 08/12/2015 0930   GLUCOSE 92 01/30/2009 1523   BUN 10 08/12/2015 0930   BUN 3* 01/30/2009 1523   CREATININE 0.95 08/12/2015 0930   CALCIUM 9.5 08/12/2015 0930   PROT 7.4 08/12/2015 0930   PROT 7.4 01/30/2009 1523   ALBUMIN 4.5 08/12/2015 0930   ALBUMIN 3.9 01/30/2009 1523   AST 17 08/12/2015  0930   ALT 11 08/12/2015 0930   ALKPHOS 45 08/12/2015 0930   BILITOT 0.3 08/12/2015 0930   BILITOT 0.6 01/30/2009 1523   GFRNONAA 108 08/12/2015 0930   GFRAA 125 08/12/2015 0930   No results found for: CHOL, HDL, LDLCALC, LDLDIRECT, TRIG, CHOLHDL Lab Results  Component Value Date   HGBA1C 5.4 08/12/2015   Lab Results  Component Value Date   VITAMINB12 243 08/12/2015   Lab Results  Component Value Date   TSH 1.270 08/12/2015    08/01/15 CT head [I reviewed images myself and agree with interpretation. -VRP]  - Negative noncontrast head CT.  08/01/15 CT cervical spine [I reviewed images myself and agree with interpretation. -VRP]  - No fracture or static subluxation of the cervical spine.   08/30/15 MRI brain/cervical [I reviewed images myself and agree with interpretation. -VRP]  - Enhancing mass lesion in the medulla. There is incomplete enhancement of  the lesion with surrounding nonenhancing edema. This extends from the lower pons through the medulla anteriorly. 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 The other possibility consider would be a tumefactive demyelinating lesion, although this is less likely given lack of other white matter lesions and the location in the medulla.  - Mild cervical disc degeneration C5-6 and C6-7. No cord lesion identified.     ASSESSMENT AND PLAN  29 y.o. year old male here with new onset left-sided numbness, slurred speech, hyperreflexia. 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 Demyelinating / inflamm disease less likely. WiIl proceed with workup.   Dx:  Brain tumor ([place]) - Plan: CT Chest W Contrast, NM PET Tum Img Whole Body, CT Abdomen Pelvis W Contrast, Ambulatory referral to Neurology, CANCELED: CT Abdomen W Contrast    PLAN: - CT screening - PET screening - second opinion referral to [facility]  Orders Placed This Encounter  Procedures  . CT Chest W Contrast  . NM PET Tum Img Whole Body  . CT Abdomen Pelvis  W Contrast  . Ambulatory referral to Neurology   Return in about 6 weeks (around 10/24/2015).  I reviewed images, labs, notes, records myself. I summarized findings and reviewed with patient, for this high risk condition (possible brain tumor) requiring high complexity decision making.   Penni Bombard, MD [id number], [id number] PM Certified in Neurology, Neurophysiology and Neuroimaging  [facility] [address] [place], [place] [address] [phone]

## 2015-10-09 ENCOUNTER — Other Ambulatory Visit: Payer: Self-pay | Admitting: Diagnostic Neuroimaging

## 2015-10-10 ENCOUNTER — Ambulatory Visit
Admission: RE | Admit: 2015-10-10 | Discharge: 2015-10-10 | Disposition: A | Discharge status: Home / Self Care | Payer: BLUE CROSS/BLUE SHIELD | Source: Ambulatory Visit | Attending: Diagnostic Neuroimaging | Admitting: Diagnostic Neuroimaging

## 2015-10-10 DIAGNOSIS — D496 Neoplasm of unspecified behavior of brain: Secondary | ICD-10-CM

## 2015-10-10 MED ORDER — IOPAMIDOL (ISOVUE-300) INJECTION 61%
125.0000 mL | Freq: Once | INTRAVENOUS | Status: AC | PRN
  Administered 2015-10-10: 125 mL via INTRAVENOUS

## 2015-10-17 ENCOUNTER — Telehealth: Payer: Self-pay | Admitting: *Deleted

## 2015-10-17 NOTE — Telephone Encounter (Signed)
[name] from [facility] returned call, stated he "is new and took over [employer] job". He stated he doesn't see anything and will give info to nurse to investigate. Gave him patient's name, MRN and referral details. He stated he would try to call back today; advised he may leave message with phone staff if he cannot reach this RN and has no further questions. He verbalized understanding.

## 2015-10-17 NOTE — Telephone Encounter (Signed)
Spoke with patient and informed him, per Dr Leta Baptist ct results are normal. Inquired if he has heard from [facility] re: referral, and he stated he has not.  Gave him number and also told him this RN will FU with [facility]. He confirmed his FU on 10/24/15, asked he arrive 15 min early to check in. He verbalized understanding.  [place], [phone], LVM requesting call back.

## 2015-10-23 NOTE — Telephone Encounter (Signed)
LVM reminding pt of FU tomorrow and inquiring if he has heard from [facility]. Advised he may wait until tomorrow if he does not want to call back today. Left this caller's name, number.

## 2015-10-24 ENCOUNTER — Encounter: Payer: Self-pay | Admitting: Diagnostic Neuroimaging

## 2015-10-24 ENCOUNTER — Ambulatory Visit (INDEPENDENT_AMBULATORY_CARE_PROVIDER_SITE_OTHER): Payer: BLUE CROSS/BLUE SHIELD | Admitting: Diagnostic Neuroimaging

## 2015-10-24 VITALS — BP 140/95 | HR 75 | Ht 71.0 in | Wt 210.0 lb

## 2015-10-24 DIAGNOSIS — R930 Abnormal findings on diagnostic imaging of skull and head, not elsewhere classified: Secondary | ICD-10-CM | POA: Diagnosis not present

## 2015-10-24 DIAGNOSIS — R9089 Other abnormal findings on diagnostic imaging of central nervous system: Secondary | ICD-10-CM

## 2015-10-24 DIAGNOSIS — D496 Neoplasm of unspecified behavior of brain: Secondary | ICD-10-CM | POA: Diagnosis not present

## 2015-10-24 NOTE — Patient Instructions (Signed)
-   repeat MRI brain - second opinion with [facility]

## 2015-10-24 NOTE — Progress Notes (Signed)
[facility]  PATIENT: Nathan Harrell DOB: [birth year]  REFERRING CLINICIAN:  HISTORY FROM: patient and mother  REASON FOR VISIT: follow up   HISTORICAL  CHIEF COMPLAINT:  Chief Complaint  Patient presents with  . Brain Tumor    rm 7, parents -Elly Modena, "mild HA, good relief w/Ibuprofen, avg 3 HA/wk"  . Follow-up    6 week    HISTORY OF PRESENT ILLNESS:   UPDATE 10/24/15: Since last visit, symptoms sig improved. Mild residual headaches. Gait, speech and numbness back to normal. Not heard yet from [facility] for second opinion.   UPDATE 09/12/15: Since last visit, sxs slightly improved. Numbness has resolved. Balance and speech are stable. 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 Here for further discussion and diagnosis/treatment options. Now established with Dr. Stephanie Acre for PCP. Had steroid shot last week with PCP for neck pain symptoms.   PRIOR HPI (08/12/15): 30 year old right-handed male here for evaluation of constellation of symptoms. 3 weeks ago patient woke up with left arm and left leg numbness. He had some pain in his neck on left side. Now having some numbness and pain on his right hand. Overall he is had some balance difficulty, clumsiness, speech difficulty, slurred speech. Symptoms have been persistent for approximately 3 weeks. No prior similar events. No prior neurologic attacks of different type. No unilateral vision loss or double vision. No bowel or bladder difficulty. Patient went to the emergency room for evaluation, had CT scan of the head which is unremarkable. Patient then referred to me for further evaluation. No family history of autoimmune disease, multiple sclerosis, or other neurologic diseases. Maternal grandmother had brain tumor and stroke.   REVIEW OF SYSTEMS: Full 14 system review of systems performed and notable only for headache.    ALLERGIES: No Known Allergies  HOME MEDICATIONS: Outpatient Prescriptions Prior to Visit   Medication Sig Dispense Refill  . ibuprofen (ADVIL,MOTRIN) 200 MG tablet Take 200 mg by mouth every 6 (six) hours as needed.    Marland Kitchen acetaminophen (TYLENOL) 500 MG tablet Take 500 mg by mouth every 6 (six) hours as needed. Reported on 10/24/2015    . aspirin-acetaminophen-caffeine (EXCEDRIN MIGRAINE) 250-250-65 MG per tablet Take 1 tablet by mouth every 6 (six) hours as needed. Reported on 10/24/2015     No facility-administered medications prior to visit.    PAST MEDICAL HISTORY: History reviewed. No pertinent past medical history.  PAST SURGICAL HISTORY: History reviewed. No pertinent past surgical history.  FAMILY HISTORY: Family History  Problem Relation Age of Onset  . Healthy Mother   . Healthy Father   . Healthy Sister   . Healthy Brother   . Brain cancer Maternal Grandmother   . Stroke Maternal Grandmother     SOCIAL HISTORY:  Social History   Social History  . Marital Status: Single    Spouse Name: N/A  . Number of Children: 0  . Years of Education: 13.5   Occupational History  .      [employer]   Social History Main Topics  . Smoking status: Never Smoker   . Smokeless tobacco: Not on file  . Alcohol Use: Yes     Comment: occasionally  . Drug Use: No  . Sexual Activity: Not on file   Other Topics Concern  . Not on file   Social History Narrative   Lives with brother   Caffeine use- coffee - approx 3 x week     PHYSICAL EXAM  GENERAL EXAM/CONSTITUTIONAL: Vitals:  Filed  Vitals:   10/24/15 1329  BP: 140/95  Pulse: 75  Height: 5\' 11"  (1.803 m)  Weight: 210 lb (95.255 kg)   Body mass index is 29.3 kg/(m^2). No exam data present  Patient is in no distress; well developed, nourished and groomed; neck is supple  CARDIOVASCULAR:  Examination of carotid arteries is normal; no carotid bruits  Regular rate and rhythm, no murmurs  Examination of peripheral vascular system by observation and palpation is normal  EYES:  Ophthalmoscopic exam of  optic discs and posterior segments is normal; no papilledema or hemorrhages  MUSCULOSKELETAL:  Gait, strength, tone, movements noted in Neurologic exam below  NEUROLOGIC: MENTAL STATUS:  No flowsheet data found.  awake, alert, oriented to person, place and time  recent and remote memory intact  normal attention and concentration  language fluent, comprehension intact, naming intact,   fund of knowledge appropriate  CRANIAL NERVE:   2nd - no papilledema on fundoscopic exam  2nd, 3rd, 4th, 6th - pupils equal and reactive to light, visual fields full to confrontation, extraocular muscles intact, no nystagmus  5th - facial sensation symmetric  7th - facial strength symmetric  8th - hearing intact  9th - palate elevates symmetrically, uvula midline  11th - shoulder shrug symmetric  12th - tongue protrusion midline   MOTOR:   normal bulk and tone, full strength in the BUE, BLE  SENSORY:   normal and symmetric to light touch, pinprick, temperature, vibration  COORDINATION:   finger-nose-finger, fine finger movements --> SLOWER WITH DYSMETRIA ON LEFT  REFLEXES:   deep tendon reflexes BRISK and symmetric; CLONUS IN L > R ANKLES  GAIT/STATION:   narrow based gait; able to walk TANDEM; romberg is negative    DIAGNOSTIC DATA (LABS, IMAGING, TESTING) - I reviewed patient records, labs, notes, testing and imaging myself where available.  Lab Results  Component Value Date   WBC 6.8 08/12/2015   HGB 15.4 01/30/2009   HCT 47.6 08/12/2015   MCV 88 08/12/2015   PLT 291 08/12/2015      Component Value Date/Time   NA 141 08/12/2015 0930   NA 142 01/30/2009 1523   K 4.4 08/12/2015 0930   CL 100 08/12/2015 0930   CO2 26 08/12/2015 0930   GLUCOSE 84 08/12/2015 0930   GLUCOSE 92 01/30/2009 1523   BUN 10 08/12/2015 0930   BUN 3* 01/30/2009 1523   CREATININE 0.95 08/12/2015 0930   CALCIUM 9.5 08/12/2015 0930   PROT 7.4 08/12/2015 0930   PROT 7.4 01/30/2009  1523   ALBUMIN 4.5 08/12/2015 0930   ALBUMIN 3.9 01/30/2009 1523   AST 17 08/12/2015 0930   ALT 11 08/12/2015 0930   ALKPHOS 45 08/12/2015 0930   BILITOT 0.3 08/12/2015 0930   BILITOT 0.6 01/30/2009 1523   GFRNONAA 108 08/12/2015 0930   GFRAA 125 08/12/2015 0930   No results found for: CHOL, HDL, LDLCALC, LDLDIRECT, TRIG, CHOLHDL Lab Results  Component Value Date   HGBA1C 5.4 08/12/2015   Lab Results  Component Value Date   VITAMINB12 243 08/12/2015   Lab Results  Component Value Date   TSH 1.270 08/12/2015    08/01/15 CT head [I reviewed images myself and agree with interpretation. -VRP]  - Negative noncontrast head CT.  08/01/15 CT cervical spine [I reviewed images myself and agree with interpretation. -VRP]  - No fracture or static subluxation of the cervical spine.  08/30/15 MRI brain/cervical [I reviewed images myself and agree with interpretation. -VRP]  - Enhancing  mass lesion in the medulla. There is incomplete enhancement of the lesion with surrounding nonenhancing edema. This extends from the lower pons through the medulla anteriorly. 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 The other possibility consider would be a tumefactive demyelinating lesion, although this is less likely given lack of other white matter lesions and the location in the medulla.  - Mild cervical disc degeneration C5-6 and C6-7. No cord lesion identified.  10/10/15 CT chest, abdomen and pelvis - normal    ASSESSMENT AND PLAN  30 y.o. year old male here with new onset left-sided numbness, slurred speech, hyperreflexia. 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 However, spontaneous improvement raises possibility of tumefactive demyelinating disease. Will repeat MRI to see if lesion is changing is size or stable.    Dx:  No diagnosis found.   PLAN: - repeat MRI brain - follow up PET scan screening - second opinion referral to [facility]  Return in about 6 weeks  (around 12/05/2015).   Penni Bombard, MD [id number], [id number] PM Certified in Neurology, Neurophysiology and Neuroimaging  [facility] [address] [place], [place] [address] [phone]

## 2015-10-24 NOTE — Progress Notes (Signed)
Pet scan not completed as ordered. Spoke with Jeanell Sparrow, [place], order was PA but expired on 10/18/15. While on phone, order resubmitted with new claim; Ray stated it required peer to peer or additional information faxed for review. Successfully faxed office notes, all scan/MRI reports to [phone].

## 2015-10-24 NOTE — Telephone Encounter (Addendum)
Patient in office for FU, has not heard from [place] at [place]. LVM for william requesting call back asap regarding patient's referral. Left this caller's name, number.  3:39 pm  Attempted to return call from [place]. LVM.

## 2015-10-30 ENCOUNTER — Telehealth: Payer: Self-pay | Admitting: Diagnostic Neuroimaging

## 2015-10-30 ENCOUNTER — Ambulatory Visit (INDEPENDENT_AMBULATORY_CARE_PROVIDER_SITE_OTHER): Payer: BLUE CROSS/BLUE SHIELD

## 2015-10-30 DIAGNOSIS — D496 Neoplasm of unspecified behavior of brain: Secondary | ICD-10-CM | POA: Diagnosis not present

## 2015-10-30 DIAGNOSIS — R930 Abnormal findings on diagnostic imaging of skull and head, not elsewhere classified: Secondary | ICD-10-CM | POA: Diagnosis not present

## 2015-10-30 DIAGNOSIS — R9089 Other abnormal findings on diagnostic imaging of central nervous system: Secondary | ICD-10-CM

## 2015-10-30 NOTE — Telephone Encounter (Signed)
Patient is calling to ask if he can get a steroid shot. He states he had one a couple of months ago at  [facility] because of neck pain. He says his symptoms have come back with the neck pain. Can he get one here or does he need to go back to [facility]/ Please call to advise. Thank you.

## 2015-10-31 ENCOUNTER — Telehealth: Payer: Self-pay | Admitting: *Deleted

## 2015-10-31 MED ORDER — GADOPENTETATE DIMEGLUMINE 469.01 MG/ML IV SOLN: 20.0000 mL | Freq: Once | INTRAVENOUS | Status: AC | PRN

## 2015-10-31 NOTE — Telephone Encounter (Signed)
Patient form on Mary Chesson desk.  

## 2015-10-31 NOTE — Telephone Encounter (Signed)
PET scan order clarified by Dr Leta Baptist. Spoke with Denny Peon, nuclear radiology to confirm patient PET scan order. Appointment scheduled for 11/01/15, arrive 8:30 am, scan @ 9 am. Spoke with patient who confirmed he can do PET scan tomorrow, arrive 8:30 am at [facility] radiology.  Advised he be NPO after midnight tonight. He stated he has not heard from [facility]; advised him this RN will call to check on status of referral.  Patient verbalized understanding of above conversation. Spoke with Denny Peon and confirmed patient will arrive at 8:30 am tomorrow for scan.

## 2015-10-31 NOTE — Telephone Encounter (Signed)
LVM requesting call back re: status of patient's referral. Left this caller's name, number.

## 2015-10-31 NOTE — Telephone Encounter (Signed)
LVM requesting call back. Advised that Dr Leta Baptist would likely refer him back to [facility] for injection. Inquired if he has heard from [facility], had MRI scheduled and heard back re: PET scan. Left this caller's name, number.

## 2015-10-31 NOTE — Telephone Encounter (Signed)
Received call back from [name], [name]. He stated he had documentation that the RN has tried to reach patient several times, and he has not returned the calls. [name] requested this RN give pt direct name, number of RN at [facility]. Spoke with [name] and gave him name, number: Nathan Harrell, [phone]. Strongly encouraged him to call right away to avoid further delay. He verbalized understanding, agreement.

## 2015-11-01 ENCOUNTER — Encounter (HOSPITAL_COMMUNITY)
Admission: RE | Admit: 2015-11-01 | Discharge: 2015-11-01 | Disposition: A | Discharge status: Home / Self Care | Payer: BLUE CROSS/BLUE SHIELD | Source: Ambulatory Visit | Attending: Diagnostic Neuroimaging | Admitting: Diagnostic Neuroimaging

## 2015-11-01 DIAGNOSIS — D496 Neoplasm of unspecified behavior of brain: Secondary | ICD-10-CM

## 2015-11-01 DIAGNOSIS — Z0189 Encounter for other specified special examinations: Secondary | ICD-10-CM | POA: Diagnosis present

## 2015-11-01 LAB — GLUCOSE, CAPILLARY: GLUCOSE-CAPILLARY: 82 mg/dL (ref 65–99)

## 2015-11-01 MED ORDER — FLUDEOXYGLUCOSE F - 18 (FDG) INJECTION: 10.4400 | Freq: Once | INTRAVENOUS | Status: DC | PRN

## 2015-11-05 ENCOUNTER — Telehealth: Payer: Self-pay | Admitting: Diagnostic Neuroimaging

## 2015-11-05 NOTE — Telephone Encounter (Signed)
LVM for medical records; requested call back to get MRI on CD to send to [facility]. Left this caller's name, number.

## 2015-11-05 NOTE — Telephone Encounter (Signed)
Spoke with Loma Sousa, [facility] and requested CD of MRI on patient. She stated she will send directly to [facility]. Gave her full name, address and name of RN, Danella Deis to mail CD. She repeated correctly, verbalized understanding.

## 2015-11-05 NOTE — Telephone Encounter (Signed)
Nathan Harrell with [facility] is returning your call.  She was not quite sure what you needed but asked for a return call [phone] opt 5.  Thanks!

## 2015-11-05 NOTE — Telephone Encounter (Signed)
Patient called to advise [facility] needs MRI results on CD sent to [facility], [place], [place], [address], [place] [id number].

## 2015-11-07 ENCOUNTER — Telehealth: Payer: Self-pay | Admitting: *Deleted

## 2015-11-07 DIAGNOSIS — Z0289 Encounter for other administrative examinations: Secondary | ICD-10-CM

## 2015-11-07 NOTE — Telephone Encounter (Signed)
Spoke with patient who stated he is waiting on call back from [facility] for his appointment. Informed him that CD was sent to [facility] earlier this week as requested. He stated he does have a name and number at [facility] he can call. He stated that his symptoms "got a little worse", so Dr [facility] prescribed Prednisone. He is taking 10 mg daily and has  tapering off schedule. He stated that "my headaches are gone, but my right hand is still a little bit numb, but that's all". He stated he "feels much better". Informed him that [employer] disability forms will be completed after he sees dr at [facility] due to the fact that his recent imaging shows decrease in size of brain lesion, per Dr Leta Baptist. Requested he call back after he sees dr at [facility] and for any problems, questions, needs.  He verbalized understanding, appreciation.

## 2015-11-21 ENCOUNTER — Telehealth: Payer: Self-pay | Admitting: *Deleted

## 2015-11-21 DIAGNOSIS — Z0289 Encounter for other administrative examinations: Secondary | ICD-10-CM

## 2015-11-21 NOTE — Telephone Encounter (Signed)
Spoke with patietn who stated he is waiting on call back from [facility] with his appointment. Informed him that his [employer] forms are ready to be faxed, but he must pay fee.  He stated he will call back to pay fee over the phone. [employer] forms taken to MR so that patient can speak with MR when he calls back to pay fee.  He verbalized understanding of above.

## 2015-11-21 NOTE — Telephone Encounter (Signed)
Darnell Level with [facility] called said they have not been able to get in touch with pt as he was not returning phone calls, they were leaving messages but he would not return the call. Today they were able to get in touch with him and make an appt at [facility] office, she doesn't know the date

## 2015-11-21 NOTE — Telephone Encounter (Signed)
Spoke with patient to FU on referral to [facility]. He stated he has not heard from [facility] about an appointment. He further stated his headaches, neck pain and numbness in all fingertips returned approximately 1 week after being seen in this office 10/24/15. He stated he was on Prednisone dose pack which relieved his headaches and neck pain but did not help his numbness. He stated he received a steroid injection in his neck yesterday from PCP. He stated he called [facility] yesterday, but did not speak with anyone.  Informed him this RN will call [facility] today and call him back  re: appt at [facility] and Dr Gladstone Lighter reply to his reoccurring pain, numbness. He then stated he cannot work, and his job is requesting papers to be completed by tomorrow. Informed him this RN would work on papers today, but cannot promise they will be completed by tomorrow. He verbalized understanding, appreciation.  LVM for Danella Deis, RN, [phone], [place] requesting call back. Then spoke with [name], [phone] at same center who stated the patient has been contacted, has not always returned calls. He stated Dr Tommi Rumps has reviewed patient's information, and [name] will contact dr for his response. He will then call this RN back. Requested he ask phone staff to skype this RN if before 12 noon today. He verbalized understanding.

## 2015-11-25 NOTE — Telephone Encounter (Signed)
I spoke with the patient to inform him that his [employer] form was faxed on 11/25/15.

## 2015-12-09 ENCOUNTER — Ambulatory Visit: Payer: BLUE CROSS/BLUE SHIELD | Admitting: Diagnostic Neuroimaging

## 2015-12-09 ENCOUNTER — Telehealth: Payer: Self-pay | Admitting: *Deleted

## 2015-12-09 NOTE — Telephone Encounter (Signed)
LVM requesting patient call back to reschedule FU with Dr Leta Baptist. Patient was seen at [facility] on 12/06/15, and per Dr Leta Baptist may follow up there. Left this caller's name, number.

## 2015-12-10 ENCOUNTER — Encounter: Payer: Self-pay | Admitting: Diagnostic Neuroimaging

## 2015-12-20 DIAGNOSIS — G379 Demyelinating disease of central nervous system, unspecified: Secondary | ICD-10-CM | POA: Insufficient documentation

## 2015-12-23 DIAGNOSIS — K5900 Constipation, unspecified: Secondary | ICD-10-CM | POA: Insufficient documentation

## 2015-12-27 DIAGNOSIS — Z531 Procedure and treatment not carried out because of patient's decision for reasons of belief and group pressure: Secondary | ICD-10-CM | POA: Insufficient documentation

## 2016-01-01 DIAGNOSIS — R6889 Other general symptoms and signs: Secondary | ICD-10-CM | POA: Insufficient documentation

## 2016-04-22 DIAGNOSIS — G369 Acute disseminated demyelination, unspecified: Secondary | ICD-10-CM | POA: Insufficient documentation

## 2016-05-08 DIAGNOSIS — R2681 Unsteadiness on feet: Secondary | ICD-10-CM | POA: Insufficient documentation

## 2016-12-03 IMAGING — CT CT HEAD W/O CM
3 series · 17 of 30 positions shown, 18 images · non-contrast
Comparison: Head CT - 11/07/2011

CLINICAL DATA: Left arm and leg numbness.  Neck pain for 2 weeks.

EXAM:
CT HEAD WITHOUT CONTRAST
CT CERVICAL SPINE WITHOUT CONTRAST
TECHNIQUE: Multidetector CT imaging of the head and cervical spine was
performed following the standard protocol without intravenous
contrast. Multiplanar CT image reconstructions of the cervical spine
were also generated.

[Series 2: head 4.8 h37s · axial · 0.46mm/px · z∈[-116,-13]mm · 4 of 36 slices shown, 5 images]
[im 8/36  brain]
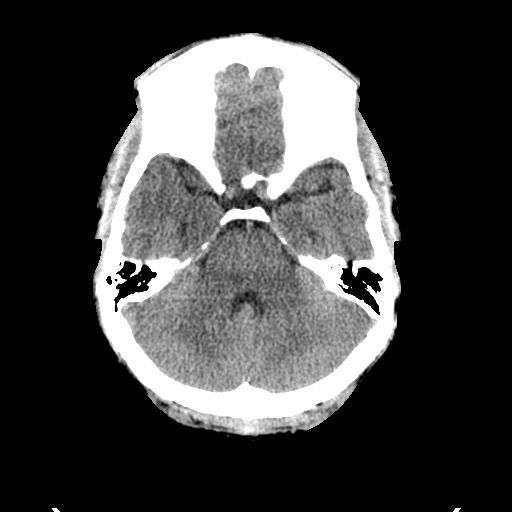
[im 8/36  bone]
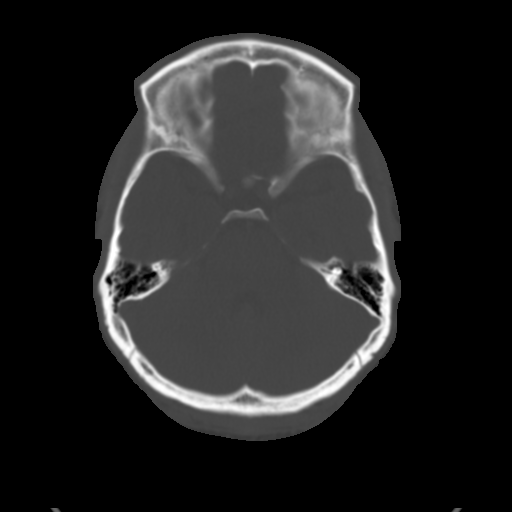
[im 15/36  brain]
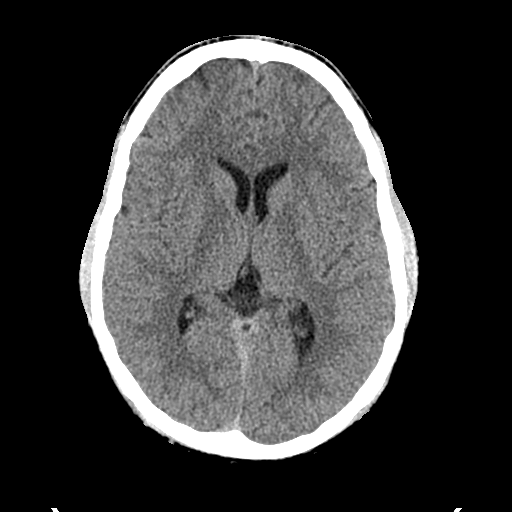
[im 22/36  brain]
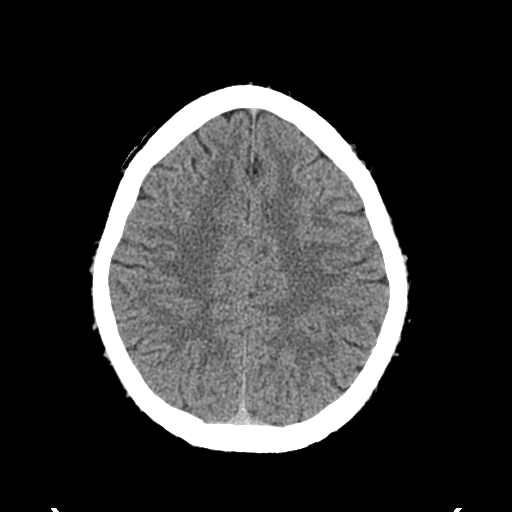
[im 29/36  brain]
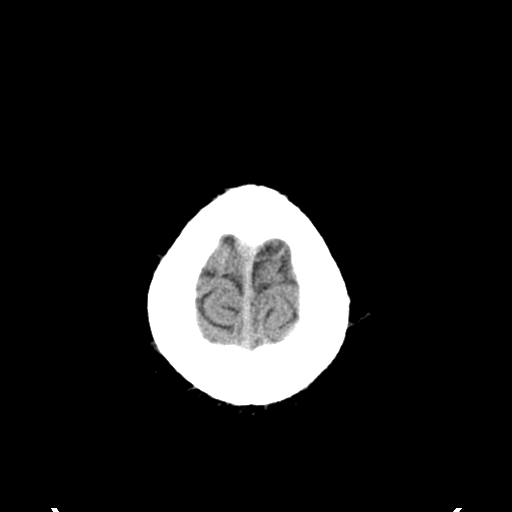

[Series 5: c_spine 2.0 b41s st · axial · 0.21mm/px · z∈[-315,-235]mm · 5 of 88 slices shown]
[im 8/88  brain]
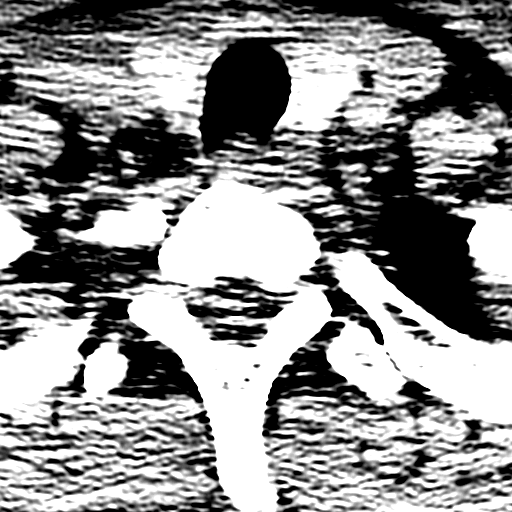
[im 16/88  brain]
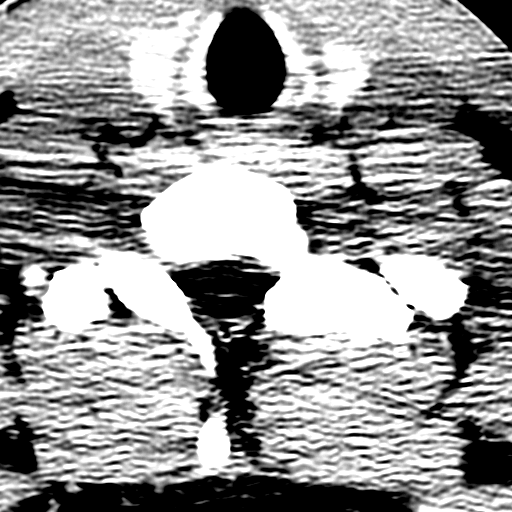
[im 32/88  brain]
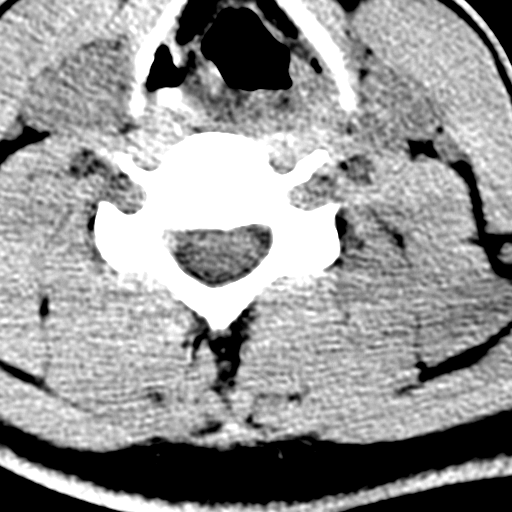
[im 40/88  brain]
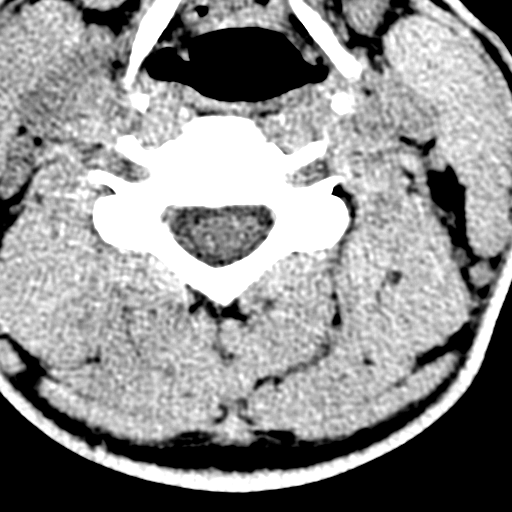
[im 48/88  brain]
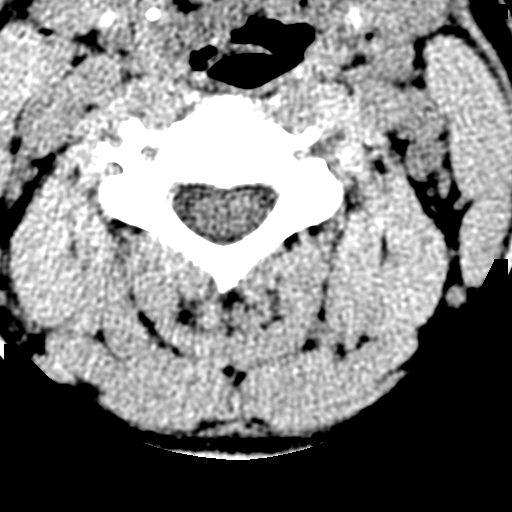

[Series 10: c_spine 2.0 orth ax · axial · 0.15mm/px · z∈[-324,-189]mm · 8 of 88 slices shown]
[im 8/88  brain]
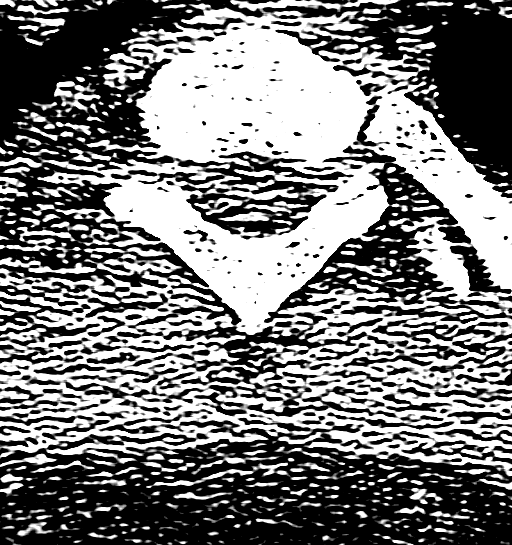
[im 16/88  brain]
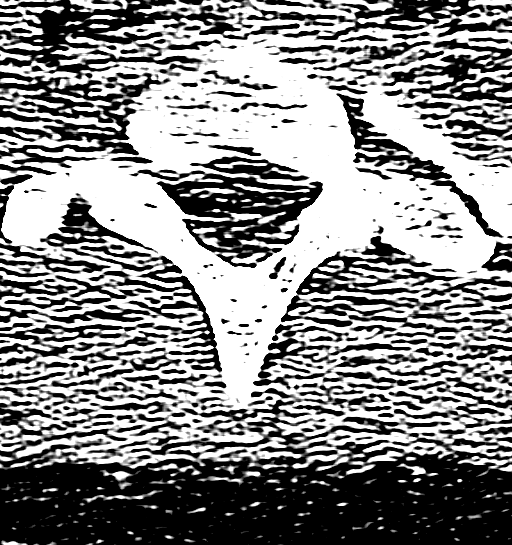
[im 32/88  brain]
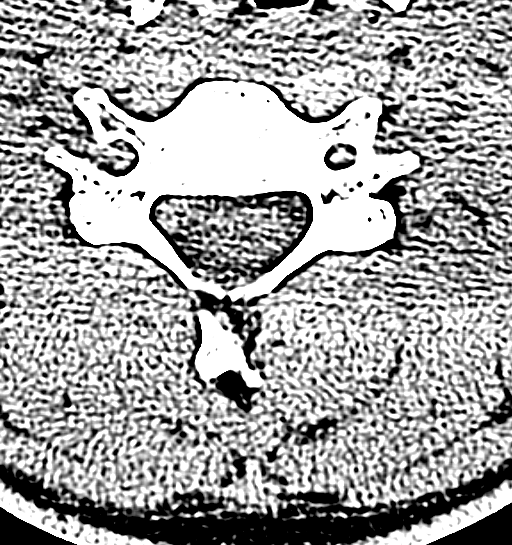
[im 40/88  brain]
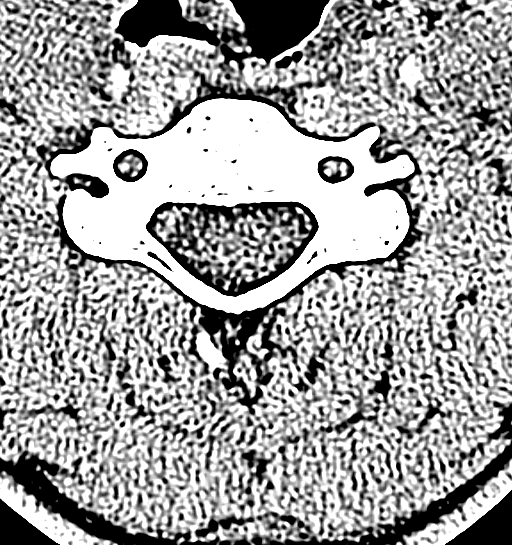
[im 48/88  brain]
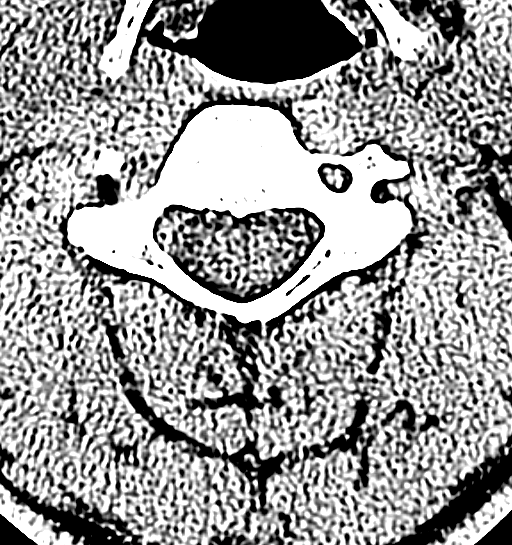
[im 56/88  brain]
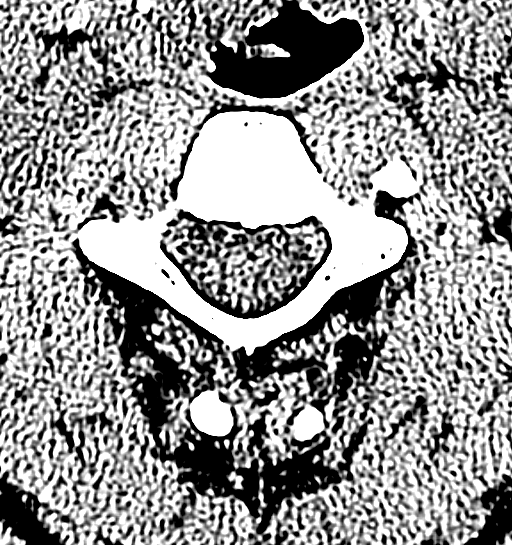
[im 72/88  brain]
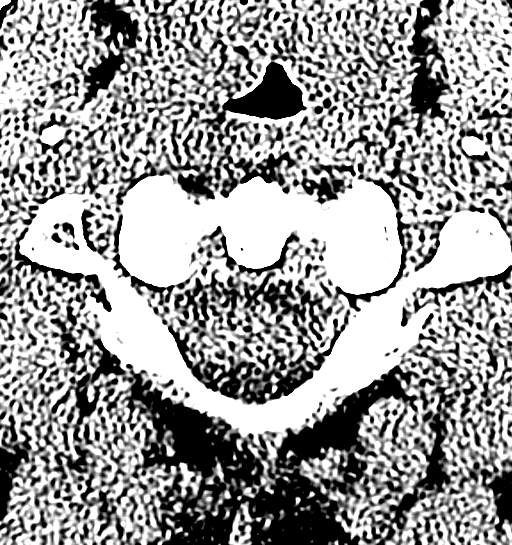
[im 80/88  brain]
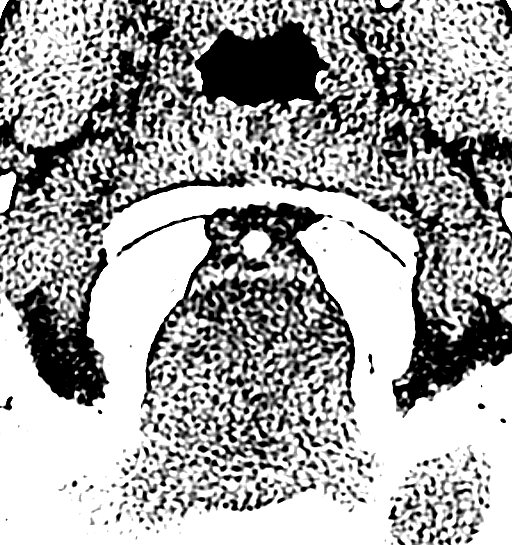

[17 of 30 positions shown; findings below may reference images not displayed]

FINDINGS: CT HEAD FINDINGS

Gray-white differentiation is maintained. No CT evidence of acute
large territory infarct. No intraparenchymal or extra-axial mass or
hemorrhage. Normal size a configuration of the ventricles and
basilar cisterns. No midline shift. Limited visualization of the
paranasal sinuses and mastoid air cells is normal. No air-fluid
levels. Regional soft tissues appear normal.

CT CERVICAL SPINE FINDINGS

C1 to the superior endplate of T2 is imaged.

Normal alignment of the cervical spine. No anterolisthesis or
retrolisthesis. The dens is normally positioned between the lateral
masses of C1. Normal atlantodental and toe axial articulations. The
bilateral facets are normally aligned.

No fracture or static subluxation of the cervical spine. Cervical
vertebral body heights are preserved. Prevertebral soft tissues are
normal.

Intervertebral disc space heights are preserved.

Regional soft tissues appear normal. Normal noncontrast appearance
of the thyroid gland. Limited visualization of lung apices is
normal.
IMPRESSION: 1. Negative noncontrast head CT.
2. No fracture or static subluxation of the cervical spine.

## 2017-12-23 DIAGNOSIS — Z0000 Encounter for general adult medical examination without abnormal findings: Secondary | ICD-10-CM | POA: Diagnosis not present

## 2017-12-23 DIAGNOSIS — Z23 Encounter for immunization: Secondary | ICD-10-CM | POA: Diagnosis not present

## 2017-12-23 DIAGNOSIS — Z131 Encounter for screening for diabetes mellitus: Secondary | ICD-10-CM | POA: Diagnosis not present

## 2017-12-23 DIAGNOSIS — Z13220 Encounter for screening for lipoid disorders: Secondary | ICD-10-CM | POA: Diagnosis not present

## 2018-02-11 DIAGNOSIS — G510 Bell's palsy: Secondary | ICD-10-CM | POA: Diagnosis not present

## 2022-01-23 ENCOUNTER — Emergency Department (HOSPITAL_BASED_OUTPATIENT_CLINIC_OR_DEPARTMENT_OTHER)
Admission: EM | Admit: 2022-01-23 | Discharge: 2022-01-23 | Disposition: A | Discharge status: Home / Self Care | Payer: BC Managed Care – PPO | Attending: Emergency Medicine | Admitting: Emergency Medicine

## 2022-01-23 ENCOUNTER — Other Ambulatory Visit (HOSPITAL_BASED_OUTPATIENT_CLINIC_OR_DEPARTMENT_OTHER): Payer: Self-pay

## 2022-01-23 ENCOUNTER — Other Ambulatory Visit: Payer: Self-pay

## 2022-01-23 ENCOUNTER — Encounter (HOSPITAL_BASED_OUTPATIENT_CLINIC_OR_DEPARTMENT_OTHER): Payer: Self-pay

## 2022-01-23 DIAGNOSIS — M79662 Pain in left lower leg: Secondary | ICD-10-CM | POA: Diagnosis not present

## 2022-01-23 DIAGNOSIS — G5702 Lesion of sciatic nerve, left lower limb: Secondary | ICD-10-CM | POA: Insufficient documentation

## 2022-01-23 MED ORDER — METHOCARBAMOL 500 MG PO TABS
500.0000 mg | ORAL_TABLET | Freq: Two times a day (BID) | ORAL | 0 refills | Status: AC
  Filled 2022-01-23: qty 60, 30d supply, fill #0

## 2022-01-23 NOTE — ED Triage Notes (Signed)
Pt arrives ambulatory to ED with c/o  pain to left upper leg/hip states that he has history of sciatica and this feels the same with this episode lasting about 3 months. 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  ?

## 2022-01-23 NOTE — ED Provider Notes (Signed)
?[facility] EMERGENCY DEPARTMENT ?Provider Note ? ? ?CSN: [id number] ?Arrival date & time: 01/23/22  2423 ? ?  ? ?History ? ?Chief Complaint  ?Patient presents with  ? Leg Injury  ? ? ?Nathan Harrell is a 36 y.o. male who presents to the ED for evaluation of left lower extremity pain.  Patient notes pain has been on and off for quite a long time, however current episode appears to be lasting for approximately 3 months.  Starts in his left mid glute and shoots down the back of his thigh.  Patient has been doing stretches and taking ibuprofen in the morning without significant improvement.  No acute injury or trauma.  He denies back pain, saddle paresthesia, urinary and bowel incontinence.  No other complaints. ? ?HPI ? ?  ? ?Home Medications ?Prior to Admission medications   ?Medication Sig Start Date End Date Taking? Authorizing Provider  ?methocarbamol (ROBAXIN) 500 MG tablet Take 1 tablet (500 mg total) by mouth 2 (two) times daily. 01/23/22 02/22/22 Yes Kathe Becton R, PA-C  ?acetaminophen (TYLENOL) 500 MG tablet Take 500 mg by mouth every 6 (six) hours as needed. Reported on 10/24/2015    [provider]  ?aspirin-acetaminophen-caffeine (EXCEDRIN MIGRAINE) [phone] MG per tablet Take 1 tablet by mouth every 6 (six) hours as needed. Reported on 10/24/2015    [provider]  ?ibuprofen (ADVIL,MOTRIN) 200 MG tablet Take 200 mg by mouth every 6 (six) hours as needed.    [provider]  ?predniSONE (STERAPRED UNI-PAK 21 TAB) 10 MG (21) TBPK tablet Take 10 mg by mouth daily. Dr Cheron Schaumann prescribed, tapering doses 11/05/15   [provider]  ?   ? ?Allergies    ?Patient has no known allergies.   ? ?Review of Systems   ?Review of Systems ? ?Physical Exam ?Updated Vital Signs ?BP 121/75 (BP Location: Right Arm)   Pulse 78   Temp 98.6 ?F (37 ?C) (Oral)   Resp 16   Ht '5\' 11"'$  (1.803 m)   Wt 86.2 kg   SpO2 98%   BMI 26.50 kg/m?  ?Physical Exam ?Vitals and nursing note  reviewed.  ?Constitutional:   ?   General: He is not in acute distress. ?   Appearance: He is not ill-appearing.  ?HENT:  ?   Head: Atraumatic.  ?Eyes:  ?   Conjunctiva/sclera: Conjunctivae normal.  ?Cardiovascular:  ?   Rate and Rhythm: Normal rate and regular rhythm.  ?   Pulses: Normal pulses.     ?     Radial pulses are 2+ on the right side and 2+ on the left side.  ?     Dorsalis pedis pulses are 2+ on the right side and 2+ on the left side.  ?   Heart sounds: No murmur heard. ?Pulmonary:  ?   Effort: Pulmonary effort is normal. No respiratory distress.  ?   Breath sounds: Normal breath sounds.  ?Abdominal:  ?   General: Abdomen is flat. There is no distension.  ?   Palpations: Abdomen is soft.  ?   Tenderness: There is no abdominal tenderness.  ?Musculoskeletal:     ?   General: Normal range of motion.  ?   Cervical back: Normal range of motion.  ?     Legs: ? ?   Comments: Nontender to palpation of the C, T, L-spine.  No paraspinal tenderness.  Full ROM of the torso. ? ?Focal tenderness of the mid gluteus muscle, positive left side  straight leg raise.  ?Skin: ?   General: Skin is warm and dry.  ?   Capillary Refill: Capillary refill takes less than 2 seconds.  ?Neurological:  ?   General: No focal deficit present.  ?   Mental Status: He is alert.  ?Psychiatric:     ?   Mood and Affect: Mood normal.  ? ? ?ED Results / Procedures / Treatments   ?Labs ?(all labs ordered are listed, but only abnormal results are displayed) ?Labs Reviewed - No data to display ? ?EKG ?None ? ?Radiology ?No results found. ? ?Procedures ?Procedures  ? ? ?Medications Ordered in ED ?Medications - No data to display ? ?ED Course/ Medical Decision Making/ A&P ?  ?                        ?Medical Decision Making ?Risk ?Prescription drug management. ? ? ?History:  ?Per HPI ?Social determinants of health: None ? ?Initial impression: ? ?This patient presents to the ED for concern of left leg pain, this involves an extensive number of  treatment options, and is a complaint that carries with it a high risk of complications and morbidity.    ? ? ?ED Course: ?36 year old male in no acute distress, nontoxic-appearing presents to the ED for evaluation of left lower extremity pain that has been constant for 3 months.  Vitals are normal.  Physical exam significant for focal tenderness of the left buttock.  Positive straight leg raise.  He is neurovascularly intact.  Given patient's history and clinical exam, I do not think additional imaging is necessary.  The patient's symptoms consistent with sciatic nerve irritation secondary to piriformis syndrome.  Advised patient on conservative therapies to include increasing frequency of ibuprofen and to continue stretches.  At home rehab stretches provided.  Per up-to-date, there is some clinical evidence for steroid injection.  He does have a PCP and notes that they do steroid injections better.  He is going to call them today to see if he can have this done.  I also recommended that he follow-up with a physical therapist which may also provide additional benefit.  Patient expresses understanding is amenable to plan. ? ?Disposition: ? ?After consideration of the diagnostic results, physical exam, history and the patients response to treatment feel that the patent would benefit from discharge.   ?Piriformis syndrome: Plan management as described above.  Discharged home in good condition. ? ? ?Final Clinical Impression(s) / ED Diagnoses ?Final diagnoses:  ?Piriformis syndrome of left side  ? ? ?Rx / DC Orders ?ED Discharge Orders   ? ?      Ordered  ?  methocarbamol (ROBAXIN) 500 MG tablet  2 times daily       ? 01/23/22 0951  ? ?  ?  ? ?  ? ? ?  ?Tonye Pearson, [place] ?01/23/22 1058 ? ?  ?Hayden Rasmussen, MD ?01/23/22 1842 ? ?

## 2022-01-23 NOTE — Discharge Instructions (Addendum)
Based on your symptoms, I think you have something called piriformis syndrome, which is when a muscle underneath the gluteus muscles becomes quite tight and can irritate the sciatic nerve.  You are already doing a good job at home with the stretching and taking ibuprofen in the morning.  I would recommend taking ibuprofen throughout the day every 6-8 hours to see if this helps keep your pain at Nathan Harrell.  I have also sent you home with a muscle relaxer as well.  Attached here are some stretching and at home rehab exercises that you can try. ? ?You may also contact your doctor and see if they can do a steroid injection which may provide some relief, and if this does not help, I would recommend maybe seeing a physical therapist for additional therapy and possibly dry needling which may help as well. ?

## 2022-01-26 ENCOUNTER — Ambulatory Visit
Admission: EM | Admit: 2022-01-26 | Discharge: 2022-01-26 | Disposition: A | Discharge status: Home / Self Care | Payer: BC Managed Care – PPO | Attending: Internal Medicine | Admitting: Internal Medicine

## 2022-01-26 DIAGNOSIS — G5702 Lesion of sciatic nerve, left lower limb: Secondary | ICD-10-CM | POA: Diagnosis not present

## 2022-01-26 MED ORDER — METHYLPREDNISOLONE SODIUM SUCC 125 MG IJ SOLR
80.0000 mg | Freq: Once | INTRAMUSCULAR | Status: AC
  Administered 2022-01-26: 80 mg via INTRAMUSCULAR

## 2022-01-26 NOTE — Discharge Instructions (Signed)
You were given a steroid injection today.  Please follow-up with orthopedist for further evaluation and management. ?

## 2022-01-26 NOTE — ED Triage Notes (Signed)
Patient presents to Urgent Care with complaints of Lleg pain for 3 years that has been off and on with flair ups since the original injury pt was dx with sciatica and then most recently with periformis at the er a few days ago. Patient reports the pain in the back of his left leg from glute to knee, nagging pain that is costant. Pt wants cortisone shot.  ? ?

## 2022-01-26 NOTE — ED Provider Notes (Signed)
?[facility] ? ? ? ?CSN: [id number] ?Arrival date & time: 01/26/22  1200 ? ? ?  ? ?History   ?Chief Complaint ?Chief Complaint  ?Patient presents with  ? Leg Pain  ? ? ?HPI ?Nathan Harrell is a 36 y.o. male.  ? ?Patient presents with left leg pain that has been intermittent over the past 3 years.  He reports that it has been flaring up more often lately.  He reports history of sciatica inflammation in this location.  Patient reports pain in the left buttocks that radiates down left leg.  He was seen in the ED on 01/23/2022 and was given muscle relaxer with some improvement in pain.  He has also been taking Aleve with improvement.  Patient was offered steroid injection at ED but declined stating that he wished for his PCP to administer it.  Patient reports that he has not followed up with PCP and wishes for steroid injection to be given today.  Denies any numbness or tingling.  Denies urinary frequency, urinary or bowel incontinence, saddle anesthesia.  Denies any obvious injury. ? ? ?Leg Pain ? ?History reviewed. No pertinent past medical history. ? ?There are no problems to display for this patient. ? ? ?History reviewed. No pertinent surgical history. ? ? ? ? ?Home Medications   ? ?Prior to Admission medications   ?Medication Sig Start Date End Date Taking? Authorizing Provider  ?acetaminophen (TYLENOL) 500 MG tablet Take 500 mg by mouth every 6 (six) hours as needed. Reported on 10/24/2015    [provider]  ?aspirin-acetaminophen-caffeine (EXCEDRIN MIGRAINE) [phone] MG per tablet Take 1 tablet by mouth every 6 (six) hours as needed. Reported on 10/24/2015    [provider]  ?ibuprofen (ADVIL,MOTRIN) 200 MG tablet Take 200 mg by mouth every 6 (six) hours as needed.    [provider]  ?methocarbamol (ROBAXIN) 500 MG tablet Take 1 tablet (500 mg total) by mouth 2 (two) times daily. 01/23/22 02/22/22  Tonye Pearson, PA-C  ?predniSONE (STERAPRED UNI-PAK 21 TAB) 10 MG (21)  TBPK tablet Take 10 mg by mouth daily. Dr Cheron Schaumann prescribed, tapering doses 11/05/15   [provider]  ? ? ?Family History ?Family History  ?Problem Relation Age of Onset  ? Healthy Mother   ? Healthy Father   ? Healthy Sister   ? Healthy Brother   ? Brain cancer Maternal Grandmother   ? Stroke Maternal Grandmother   ? ? ?Social History ?Social History  ? ?Tobacco Use  ? Smoking status: Never  ?Vaping Use  ? Vaping Use: Never used  ?Substance Use Topics  ? Alcohol use: Yes  ?  Comment: occasionally  ? Drug use: No  ? ? ? ?Allergies   ?Patient has no known allergies. ? ? ?Review of Systems ?Review of Systems ?Per HPI ? ?Physical Exam ?Triage Vital Signs ?ED Triage Vitals  ?Enc Vitals Group  ?   BP 01/26/22 1351 120/79  ?   Pulse Rate 01/26/22 1351 63  ?   Resp 01/26/22 1351 18  ?   Temp 01/26/22 1351 98 ?F (36.7 ?C)  ?   Temp src --   ?   SpO2 01/26/22 1351 97 %  ?   Weight --   ?   Height --   ?   Head Circumference --   ?   Peak Flow --   ?   Pain Score 01/26/22 1349 7  ?   Pain Loc --   ?  Pain Edu? --   ?   Excl. in [place]? --   ? ?No data found. ? ?Updated Vital Signs ?BP 120/79   Pulse 63   Temp 98 ?F (36.7 ?C)   Resp 18   SpO2 97%  ? ?Visual Acuity ?Right Eye Distance:   ?Left Eye Distance:   ?Bilateral Distance:   ? ?Right Eye Near:   ?Left Eye Near:    ?Bilateral Near:    ? ?Physical Exam ?Constitutional:   ?   General: He is not in acute distress. ?   Appearance: Normal appearance. He is not toxic-appearing or diaphoretic.  ?HENT:  ?   Head: Normocephalic and atraumatic.  ?Eyes:  ?   Extraocular Movements: Extraocular movements intact.  ?   Conjunctiva/sclera: Conjunctivae normal.  ?Pulmonary:  ?   Effort: Pulmonary effort is normal.  ?Musculoskeletal:  ?   Lumbar back: Tenderness present. No swelling, edema or bony tenderness. Positive left straight leg raise test. Negative right straight leg raise test.  ?     Legs: ? ?   Comments: No tenderness to palpation throughout lumbar spine, paraspinal  muscles, buttocks.  No direct spinal tenderness, crepitus, step-off.   Pain occurs to circled area on diagram per patient.  Positive left straight leg raise.  ?Neurological:  ?   General: No focal deficit present.  ?   Mental Status: He is alert and oriented to person, place, and time. Mental status is at baseline.  ?Psychiatric:     ?   Mood and Affect: Mood normal.     ?   Behavior: Behavior normal.     ?   Thought Content: Thought content normal.     ?   Judgment: Judgment normal.  ? ? ? ?UC Treatments / Results  ?Labs ?(all labs ordered are listed, but only abnormal results are displayed) ?Labs Reviewed - No data to display ? ?EKG ? ? ?Radiology ?No results found. ? ?Procedures ?Procedures (including critical care time) ? ?Medications Ordered in UC ?Medications  ?methylPREDNISolone sodium succinate (SOLU-MEDROL) 125 mg/2 mL injection 80 mg (80 mg Intramuscular Given 01/26/22 1423)  ? ? ?Initial Impression / Assessment and Plan / UC Course  ?I have reviewed the triage vital signs and the nursing notes. ? ?Pertinent labs & imaging results that were available during my care of the patient were reviewed by me and considered in my medical decision making (see chart for details). ? ?  ? ?Physical exam and patient's symptoms are consistent with sciatica most likely related to piriformis syndrome of left leg. Patient requesting steroid injection and I do think this is reasonable. Discussed supportive care, ice application and heat application, and pain relievers. Advised patient to avoid NSAIDs for at least 24 hours following injection. Patient to follow up with orthopedist at provided contact info for further evaluation and management. Do not think that imaging is necessary due to no direct spinal tenderness or injury and given chronicity of issue. Discussed strict return precautions. Patient verbalized understanding and was agreeable with plan.  ?Final Clinical Impressions(s) / UC Diagnoses  ? ?Final diagnoses:   ?Piriformis syndrome of left side  ? ? ? ?Discharge Instructions   ? ?  ?You were given a steroid injection today.  Please follow-up with orthopedist for further evaluation and management. ? ? ? ?ED Prescriptions   ?None ?  ? ?PDMP not reviewed this encounter. ?  ?Teodora Medici, [place] ?01/26/22 1426 ? ?

## 2024-06-26 ENCOUNTER — Encounter: Payer: Self-pay | Admitting: Emergency Medicine

## 2024-06-26 ENCOUNTER — Ambulatory Visit: Admission: EM | Admit: 2024-06-26 | Discharge: 2024-06-26 | Disposition: A | Discharge status: Home / Self Care

## 2024-06-26 DIAGNOSIS — G5702 Lesion of sciatic nerve, left lower limb: Secondary | ICD-10-CM | POA: Diagnosis not present

## 2024-06-26 DIAGNOSIS — G939 Disorder of brain, unspecified: Secondary | ICD-10-CM | POA: Insufficient documentation

## 2024-06-26 MED ORDER — NAPROXEN 500 MG PO TABS: 500.0000 mg | ORAL_TABLET | Freq: Two times a day (BID) | ORAL | 0 refills | Status: AC

## 2024-06-26 MED ORDER — METHYLPREDNISOLONE 4 MG PO TBPK: ORAL_TABLET | ORAL | 0 refills | Status: AC

## 2024-06-26 MED ORDER — DEXAMETHASONE SODIUM PHOSPHATE 10 MG/ML IJ SOLN
10.0000 mg | Freq: Once | INTRAMUSCULAR | Status: AC
  Administered 2024-06-26: 10 mg via INTRAMUSCULAR

## 2024-06-26 NOTE — ED Triage Notes (Signed)
 Pt presents c/o Sciatica nerve pain x 9 days. Pt reports he has felt this pain previously about 2 years ago. Pt denies any additional sxs.

## 2024-06-26 NOTE — Discharge Instructions (Addendum)
 You were seen today for pain in your left buttock that radiates down the back of your thigh toward your knee. Your exam was reassuring and did not show any red flag findings, and your symptoms are most consistent with piriformis syndrome, which occurs when a muscle in the buttock irritates the sciatic nerve. You received an injection of Decadron  in the clinic to help with inflammation. You have also been prescribed naproxen  to take twice daily and a Medrol  dose pack. Do not take additional over-the-counter NSAIDs such as ibuprofen or Aleve  while on naproxen , but you may take Tylenol if you need extra pain relief. Supportive measures such as rest, applying ice or heat, and gentle stretching of the hip and buttock muscles can help reduce discomfort. Try to avoid prolonged sitting or activities that make the pain worse. Most people improve over time with these treatments. Follow up with your primary care provider or an orthopedic specialist if your pain does not improve or if it returns after treatment. Go to the emergency department immediately if you develop sudden or severe worsening pain, new numbness, weakness, difficulty walking, or changes in bowel or bladder control.

## 2024-06-26 NOTE — ED Provider Notes (Signed)
 [facility]    CSN: [id number] Arrival date & time: 06/26/24  1348      History   Chief Complaint Chief Complaint  Patient presents with   Leg Pain    Left leg    Sciatica nerve pain     HPI Nathan Harrell is a 38 y.o. male.   Discussed the use of AI scribe software for clinical note transcription with the patient, who gave verbal consent to proceed.   The patient presents with left-sided sciatic nerve pain that has been ongoing for approximately 9 days. This is a recurrence of a similar episode experienced about 2.5 years ago. The pain originates in the patient's left buttock and radiates down the back of the left leg, extending to just below the knee. The patient denies any specific injury but reports engaging in routine lifting at work. The pain is described as shooting in nature, without associated numbness or tingling. The patient has not experienced any weakness in the leg, groin pain, or changes in bowel or bladder function. There is no reported blood in urine or stool. To manage the pain, the patient has tried over-the-counter naproxen  and Tylenol, which provided slight relief but did not fully resolve the symptoms. They have also used both heating pads and ice packs, with minimal effect.  The following sections of the patient's history were reviewed and updated as appropriate: allergies, current medications, past family history, past medical history, past social history, past surgical history, and problem list.     History reviewed. No pertinent past medical history.  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 No pertinent surgical  history.     Home Medications    Prior to Admission medications   Medication Sig Start Date End Date Taking? Authorizing Provider  methylPREDNISolone  (MEDROL  DOSEPAK) 4 MG TBPK tablet Take as directed 06/26/24  Yes Iola Lukes, FNP  naproxen  (NAPROSYN ) 500 MG tablet Take 1 tablet (500 mg total) by mouth 2 (two) times daily with a meal. Take with food to avoid stomach upset. Do not take any additional NSAIDs while on this. You may take tylenol in addition to this if needed for extra pain relief. 06/26/24  Yes Merrily Tegeler, FNP  acetaminophen (TYLENOL) 500 MG tablet Take 500 mg by mouth every 6 (six) hours as needed. Reported on 10/24/2015    [provider]    Family History Family History  Problem Relation Age of Onset   Healthy Mother    Healthy Father    Healthy Sister    Healthy Brother    Brain cancer Maternal Grandmother    Stroke Maternal Grandmother     Social History Social History   Tobacco Use   Smoking status: Never    Passive exposure: Never   Smokeless tobacco: Never  Vaping Use   Vaping status: Never Used  Substance Use Topics   Alcohol use: Yes    Comment: occasionally   Drug use: No     Allergies   Patient has no known allergies.   Review of Systems Review of Systems  Gastrointestinal:  Negative for blood in stool.       No bowel incontinence  Genitourinary:  Negative for dysuria and hematuria.       No bladder incontinence  Musculoskeletal:  Negative for back pain and gait problem.  Neurological:  Negative for weakness and numbness.  All other systems reviewed and are negative.    Physical Exam Triage Vital Signs ED Triage Vitals  Encounter Vitals Group     BP 06/26/24 1443 134/88     Girls Systolic BP Percentile --      Girls Diastolic BP Percentile --      Boys Systolic BP Percentile --      Boys Diastolic BP Percentile --      Pulse Rate 06/26/24 1443 83     Resp 06/26/24 1443 16     Temp 06/26/24 1443 98.6 F  (37 C)     Temp Source 06/26/24 1443 Oral     SpO2 06/26/24 1443 97 %     Weight 06/26/24 1441 179 lb 14.4 oz (81.6 kg)     Height --      Head Circumference --      Peak Flow --      Pain Score 06/26/24 1440 6     Pain Loc --      Pain Education --      Exclude from Growth Chart --    No data found.  Updated Vital Signs BP 134/88 (BP Location: Left Arm)   Pulse 83   Temp 98.6 F (37 C) (Oral)   Resp 16   Wt 179 lb 14.4 oz (81.6 kg)   SpO2 97%   BMI 25.09 kg/m   Visual Acuity Right Eye Distance:   Left Eye Distance:   Bilateral Distance:    Right Eye Near:   Left Eye Near:    Bilateral Near:     Physical Exam Vitals reviewed.  Constitutional:      General: He is not in acute distress.    Appearance: Normal appearance. He is not ill-appearing, toxic-appearing or diaphoretic.  HENT:     Head: Normocephalic.     Mouth/Throat:     Mouth: Mucous membranes are moist.  Cardiovascular:     Rate and Rhythm: Normal rate and regular rhythm.  Pulmonary:     Effort: Pulmonary effort is normal.     Breath sounds: Normal breath sounds.  Abdominal:     Palpations: Abdomen is soft.     Tenderness: There is no right CVA tenderness or left CVA tenderness.  Musculoskeletal:        General: Normal range of motion.     Cervical back: Normal, normal range of motion and neck supple.     Thoracic back: Normal.     Lumbar back: No swelling, deformity, lacerations, spasms or tenderness. Normal range of motion.     Comments: Mid-left buttock pain. Straight leg raise on the left is positive. Strength, sensation, and range of motion of the left lower extremity are intact. Neurovascular status is preserved.  Skin:    General: Skin is warm and dry.  Neurological:     General: No focal deficit present.     Mental Status: He is alert and oriented to person, place, and time.     Cranial Nerves: Cranial nerves 2-12 are intact.     Sensory: Sensation is intact.     Motor: Motor function  is intact. No weakness.     Coordination: Coordination is intact.     Gait: Gait is intact.  Psychiatric:        Mood and Affect:  Mood normal.        Speech: Speech normal.        Behavior: Behavior normal. Behavior is cooperative.      UC Treatments / Results  Labs (all labs ordered are listed, but only abnormal results are displayed) Labs Reviewed - No data to display  EKG   Radiology No results found.  Procedures Procedures (including critical care time)  Medications Ordered in UC Medications  dexamethasone  (DECADRON ) injection 10 mg (has no administration in time range)    Initial Impression / Assessment and Plan / UC Course  I have reviewed the triage vital signs and the nursing notes.  Pertinent labs & imaging results that were available during my care of the patient were reviewed by me and considered in my medical decision making (see chart for details).     The patient was evaluated for left buttock pain radiating down the posterior thigh to the level of the knee. There are no red flag signs or focal neurological deficits on exam. Clinical presentation is consistent with piriformis syndrome. Treatment was initiated with a Decadron  injection in clinic and prescriptions for naproxen  twice daily and a Medrol  dose pack were provided. The patient was instructed to avoid additional over-the-counter NSAIDs while on naproxen  but may use Tylenol for added pain relief if needed. Supportive measures including rest, stretching, and activity modification were discussed. Follow-up with orthopedics was advised if symptoms fail to improve or recur, and emergency precautions were given for new or worsening neurological deficits, bowel or bladder changes, or severe pain.  Today's evaluation has revealed no signs of a dangerous process. Discussed diagnosis with patient and/or guardian. Patient and/or guardian aware of their diagnosis, possible red flag symptoms to watch out for and need  for close follow up. Patient and/or guardian understands verbal and written discharge instructions. Patient and/or guardian comfortable with plan and disposition.  Patient and/or guardian has a clear mental status at this time, good insight into illness (after discussion and teaching) and has clear judgment to make decisions regarding their care  Documentation was completed with the aid of voice recognition software. Transcription may contain typographical errors.  Final Clinical Impressions(s) / UC Diagnoses   Final diagnoses:  Piriformis syndrome of left side     Discharge Instructions      You were seen today for pain in your left buttock that radiates down the back of your thigh toward your knee. Your exam was reassuring and did not show any red flag findings, and your symptoms are most consistent with piriformis syndrome, which occurs when a muscle in the buttock irritates the sciatic nerve. You received an injection of Decadron  in the clinic to help with inflammation. You have also been prescribed naproxen  to take twice daily and a Medrol  dose pack. Do not take additional over-the-counter NSAIDs such as ibuprofen or Aleve  while on naproxen , but you may take Tylenol if you need extra pain relief. Supportive measures such as rest, applying ice or heat, and gentle stretching of the hip and buttock muscles can help reduce discomfort. Try to avoid prolonged sitting or activities that make the pain worse. Most people improve over time with these treatments. Follow up with your primary care provider or an orthopedic specialist if your pain does not improve or if it returns after treatment. Go to the emergency department immediately if you develop sudden or severe worsening pain, new numbness, weakness, difficulty walking, or changes in bowel or bladder control.  ED Prescriptions     Medication Sig Dispense Auth. Provider   naproxen  (NAPROSYN ) 500 MG tablet Take 1 tablet (500 mg total) by  mouth 2 (two) times daily with a meal. Take with food to avoid stomach upset. Do not take any additional NSAIDs while on this. You may take tylenol in addition to this if needed for extra pain relief. 20 tablet Iola Lukes, FNP   methylPREDNISolone  (MEDROL  DOSEPAK) 4 MG TBPK tablet Take as directed 21 tablet Iola Lukes, FNP      PDMP not reviewed this encounter.   Iola Lukes, [place] 06/26/24 1626

## 2024-06-27 ENCOUNTER — Ambulatory Visit: Payer: Self-pay
